# Patient Record
Sex: Female | Born: 1975 | Race: Black or African American | Hispanic: No | Marital: Single | Smoking: Never smoker
Health system: Southern US, Community
[De-identification: ages and names within clinical notes are randomized; demographics above are authoritative.]

## PROBLEM LIST (undated history)

## (undated) DIAGNOSIS — G43909 Migraine, unspecified, not intractable, without status migrainosus: Secondary | ICD-10-CM

## (undated) DIAGNOSIS — K469 Unspecified abdominal hernia without obstruction or gangrene: Secondary | ICD-10-CM

## (undated) DIAGNOSIS — O1410 Severe pre-eclampsia, unspecified trimester: Secondary | ICD-10-CM

## (undated) DIAGNOSIS — K625 Hemorrhage of anus and rectum: Secondary | ICD-10-CM

## (undated) DIAGNOSIS — O24414 Gestational diabetes mellitus in pregnancy, insulin controlled: Secondary | ICD-10-CM

## (undated) DIAGNOSIS — R7303 Prediabetes: Secondary | ICD-10-CM

## (undated) DIAGNOSIS — N939 Abnormal uterine and vaginal bleeding, unspecified: Secondary | ICD-10-CM

## (undated) HISTORY — DX: Gestational diabetes mellitus in pregnancy, insulin controlled: O24.414

## (undated) HISTORY — DX: Abnormal uterine and vaginal bleeding, unspecified: N93.9

## (undated) HISTORY — DX: Migraine, unspecified, not intractable, without status migrainosus: G43.909

## (undated) HISTORY — DX: Severe pre-eclampsia, unspecified trimester: O14.10

## (undated) HISTORY — DX: Hemorrhage of anus and rectum: K62.5

## (undated) HISTORY — DX: Prediabetes: R73.03

## (undated) HISTORY — DX: Unspecified abdominal hernia without obstruction or gangrene: K46.9

---

## 2005-07-29 DIAGNOSIS — K469 Unspecified abdominal hernia without obstruction or gangrene: Secondary | ICD-10-CM

## 2005-07-29 HISTORY — PX: HERNIA REPAIR: SHX51

## 2005-07-29 HISTORY — DX: Unspecified abdominal hernia without obstruction or gangrene: K46.9

## 2016-07-29 DIAGNOSIS — K625 Hemorrhage of anus and rectum: Secondary | ICD-10-CM

## 2016-07-29 DIAGNOSIS — N939 Abnormal uterine and vaginal bleeding, unspecified: Secondary | ICD-10-CM

## 2016-07-29 HISTORY — PX: COLONOSCOPY: SHX174

## 2016-07-29 HISTORY — PX: ENDOMETRIAL BIOPSY: SHX622

## 2016-07-29 HISTORY — DX: Hemorrhage of anus and rectum: K62.5

## 2016-07-29 HISTORY — DX: Abnormal uterine and vaginal bleeding, unspecified: N93.9

## 2018-09-28 LAB — CHG URINE PREGNANCY TEST: PREG TEST UR: POSITIVE

## 2018-10-13 ENCOUNTER — Encounter: Payer: Self-pay | Admitting: *Deleted

## 2018-10-19 ENCOUNTER — Encounter: Payer: Self-pay | Admitting: *Deleted

## 2018-10-30 ENCOUNTER — Ambulatory Visit (INDEPENDENT_AMBULATORY_CARE_PROVIDER_SITE_OTHER): Payer: Self-pay | Admitting: *Deleted

## 2018-10-30 ENCOUNTER — Encounter: Payer: Self-pay | Admitting: *Deleted

## 2018-10-30 ENCOUNTER — Other Ambulatory Visit: Payer: Self-pay

## 2018-10-30 DIAGNOSIS — O09219 Supervision of pregnancy with history of pre-term labor, unspecified trimester: Secondary | ICD-10-CM

## 2018-10-30 DIAGNOSIS — O09529 Supervision of elderly multigravida, unspecified trimester: Secondary | ICD-10-CM

## 2018-10-30 DIAGNOSIS — O0930 Supervision of pregnancy with insufficient antenatal care, unspecified trimester: Secondary | ICD-10-CM

## 2018-10-30 DIAGNOSIS — O09899 Supervision of other high risk pregnancies, unspecified trimester: Secondary | ICD-10-CM | POA: Insufficient documentation

## 2018-10-30 DIAGNOSIS — O0990 Supervision of high risk pregnancy, unspecified, unspecified trimester: Secondary | ICD-10-CM

## 2018-10-30 NOTE — Progress Notes (Signed)
Patient ID: Lisa Webb, female   DOB: Jan 09, 1976, 43 y.o.   MRN: [id number] I have reviewed the chart and agree with nursing staff's documentation of this patient's encounter.  Scheryl Darter, MD 10/30/2018 1:02 PM

## 2018-10-30 NOTE — Progress Notes (Signed)
I connected with  Lisa Webb on 10/30/18 at 9:28 AM EDT by telephone and verified that I am speaking with the correct person using two identifiers.   I discussed the limitations, risks, security and privacy concerns of performing an evaluation and management service by telephone and the availability of in person appointments. I also discussed with the patient that there may be a patient responsible charge related to this service. The patient expressed understanding and agreed to proceed.  New Ob intake completed. Total time spent talking w/pt = 1.5 hrs. Pt has unsure LMP and obtaining accurate historical information from pt was difficult at times during the interview. Pregnancy is dated by US performed on 3/2 @ [facility] - report scanned to media tab. **Note - page #3 of the paperwork scanned as verification of pregnancy does not belong to this pt.**  [place] for anatomy and initial prenatal visit both scheduled on 4/17. Pt agreed to complete registration for MyChart and Babyscripts Optimization. She understands she will receive BP cuff @ visit on 4/17 and that some visit will be by Telehealth or virtual visits.    Day, Drucilla Schmidt, RN 10/30/2018  10:48 AM

## 2018-11-09 ENCOUNTER — Encounter: Payer: Self-pay | Admitting: *Deleted

## 2018-11-13 ENCOUNTER — Encounter: Payer: Self-pay | Admitting: Obstetrics & Gynecology

## 2018-11-13 ENCOUNTER — Other Ambulatory Visit (HOSPITAL_COMMUNITY): Payer: Self-pay | Admitting: *Deleted

## 2018-11-13 ENCOUNTER — Other Ambulatory Visit: Payer: Self-pay

## 2018-11-13 ENCOUNTER — Ambulatory Visit (HOSPITAL_COMMUNITY)
Admission: RE | Admit: 2018-11-13 | Discharge: 2018-11-13 | Disposition: A | Discharge status: Home / Self Care | Payer: Medicaid Other | Source: Ambulatory Visit | Attending: Obstetrics and Gynecology | Admitting: Obstetrics and Gynecology

## 2018-11-13 ENCOUNTER — Other Ambulatory Visit: Payer: Self-pay | Admitting: Obstetrics & Gynecology

## 2018-11-13 DIAGNOSIS — O0990 Supervision of high risk pregnancy, unspecified, unspecified trimester: Secondary | ICD-10-CM | POA: Insufficient documentation

## 2018-11-13 DIAGNOSIS — O09212 Supervision of pregnancy with history of pre-term labor, second trimester: Secondary | ICD-10-CM

## 2018-11-13 DIAGNOSIS — O0932 Supervision of pregnancy with insufficient antenatal care, second trimester: Secondary | ICD-10-CM | POA: Diagnosis not present

## 2018-11-13 DIAGNOSIS — Z363 Encounter for antenatal screening for malformations: Secondary | ICD-10-CM

## 2018-11-13 DIAGNOSIS — O09522 Supervision of elderly multigravida, second trimester: Secondary | ICD-10-CM | POA: Diagnosis not present

## 2018-11-13 DIAGNOSIS — O09529 Supervision of elderly multigravida, unspecified trimester: Secondary | ICD-10-CM

## 2018-11-13 DIAGNOSIS — Z3A26 26 weeks gestation of pregnancy: Secondary | ICD-10-CM

## 2018-11-13 DIAGNOSIS — O3412 Maternal care for benign tumor of corpus uteri, second trimester: Secondary | ICD-10-CM | POA: Diagnosis not present

## 2018-11-13 DIAGNOSIS — Z3687 Encounter for antenatal screening for uncertain dates: Secondary | ICD-10-CM

## 2018-11-13 DIAGNOSIS — O0930 Supervision of pregnancy with insufficient antenatal care, unspecified trimester: Secondary | ICD-10-CM

## 2018-11-18 ENCOUNTER — Encounter: Payer: Self-pay | Admitting: Obstetrics and Gynecology

## 2018-11-18 ENCOUNTER — Other Ambulatory Visit: Payer: Self-pay

## 2018-11-18 ENCOUNTER — Ambulatory Visit (INDEPENDENT_AMBULATORY_CARE_PROVIDER_SITE_OTHER): Payer: Medicaid Other | Admitting: Obstetrics and Gynecology

## 2018-11-18 ENCOUNTER — Telehealth: Payer: Self-pay | Admitting: Obstetrics and Gynecology

## 2018-11-18 ENCOUNTER — Other Ambulatory Visit (HOSPITAL_COMMUNITY)
Admission: RE | Admit: 2018-11-18 | Discharge: 2018-11-18 | Disposition: A | Discharge status: Home / Self Care | Payer: Medicaid Other | Source: Ambulatory Visit | Attending: Obstetrics and Gynecology | Admitting: Obstetrics and Gynecology

## 2018-11-18 VITALS — BP 118/74 | HR 79 | Ht 63.0 in | Wt 223.0 lb

## 2018-11-18 DIAGNOSIS — Z3A26 26 weeks gestation of pregnancy: Secondary | ICD-10-CM | POA: Diagnosis not present

## 2018-11-18 DIAGNOSIS — O09212 Supervision of pregnancy with history of pre-term labor, second trimester: Secondary | ICD-10-CM | POA: Diagnosis not present

## 2018-11-18 DIAGNOSIS — O0990 Supervision of high risk pregnancy, unspecified, unspecified trimester: Secondary | ICD-10-CM

## 2018-11-18 DIAGNOSIS — O09522 Supervision of elderly multigravida, second trimester: Secondary | ICD-10-CM

## 2018-11-18 DIAGNOSIS — O1210 Gestational proteinuria, unspecified trimester: Secondary | ICD-10-CM

## 2018-11-18 DIAGNOSIS — O1212 Gestational proteinuria, second trimester: Secondary | ICD-10-CM | POA: Diagnosis not present

## 2018-11-18 DIAGNOSIS — Z113 Encounter for screening for infections with a predominantly sexual mode of transmission: Secondary | ICD-10-CM | POA: Diagnosis not present

## 2018-11-18 DIAGNOSIS — O09219 Supervision of pregnancy with history of pre-term labor, unspecified trimester: Secondary | ICD-10-CM

## 2018-11-18 DIAGNOSIS — O0992 Supervision of high risk pregnancy, unspecified, second trimester: Secondary | ICD-10-CM | POA: Insufficient documentation

## 2018-11-18 DIAGNOSIS — O0932 Supervision of pregnancy with insufficient antenatal care, second trimester: Secondary | ICD-10-CM

## 2018-11-18 DIAGNOSIS — Z1151 Encounter for screening for human papillomavirus (HPV): Secondary | ICD-10-CM

## 2018-11-18 DIAGNOSIS — R81 Glycosuria: Secondary | ICD-10-CM

## 2018-11-18 DIAGNOSIS — O09899 Supervision of other high risk pregnancies, unspecified trimester: Secondary | ICD-10-CM

## 2018-11-18 DIAGNOSIS — O0930 Supervision of pregnancy with insufficient antenatal care, unspecified trimester: Secondary | ICD-10-CM

## 2018-11-18 DIAGNOSIS — O09523 Supervision of elderly multigravida, third trimester: Secondary | ICD-10-CM

## 2018-11-18 DIAGNOSIS — Z124 Encounter for screening for malignant neoplasm of cervix: Secondary | ICD-10-CM | POA: Diagnosis present

## 2018-11-18 LAB — GLUCOSE, CAPILLARY: Glucose-Capillary: 147 mg/dL — ABNORMAL HIGH (ref 70–99)

## 2018-11-18 LAB — POCT URINALYSIS DIP (DEVICE)
Bilirubin Urine: NEGATIVE
Glucose, UA: >=1000 mg/dL — AB
Leukocytes,Ua: NEGATIVE
Nitrite: NEGATIVE
Protein, ur: 30 mg/dL — AB
Specific Gravity, Urine: 1.025 (ref 1.005–1.030)
Urobilinogen, UA: 0.2 mg/dL (ref 0.0–1.0)
pH: 5.5 (ref 5.0–8.0)

## 2018-11-18 NOTE — Progress Notes (Signed)
New OB Note  11/18/2018   [facility]  Chief Complaint: NOB  Transfer of Care Patient: no  History of Present Illness: Ms. Lisa HockeyRobinson is a 43 y.o. [id number] @ 26/6 weeks (EDC 7/23, based on 26wk u/s) Patient's last menstrual period was 05/12/2018 (approximate).).  Preg complicated by has Supervision of high risk pregnancy, antepartum; Late prenatal care, antepartum; Advanced maternal age in multigravida; and History of preterm delivery, currently pregnant on their problem list.   Any events prior to today's visit: no She was using no method when she conceived.  She has Negative signs or symptoms of miscarriage or preterm labor On any medications around the time she conceived/early pregnancy: No   ROS: A 12-point review of systems was performed and negative, except as stated in the above HPI.  OBGYN History: As per HPI. OB History  Gravida Para Term Preterm AB Living  5 3 2 1 1 3   SAB TAB Ectopic Multiple Live Births  1       3    # Outcome Date GA Lbr Len/2nd Weight Sex Delivery Anes PTL Lv  5 Current           4 Term 04/03/06 6859w0d  8 lb 3 oz (3.714 kg) F Vag-Spont EPI N LIV  3 SAB 02/14/05 6760w0d         2 Term 09/19/99 3859w0d  7 lb 6 oz (3.345 kg) F Vag-Spont None N LIV  1 Preterm 09/08/94   4 lb 7 oz (2.013 kg) M Vag-Spont None N LIV    Any issues with any prior pregnancies: G1 PTL, PTB Prior children are healthy, doing well, and without any problems or issues: yes History of pap smears: Yes. Last pap smear unsure. H/o abnormal paps   Past Medical History: Past Medical History:  Diagnosis Date  . Abnormal uterine bleeding 2018  . Hernia, abdominal 2007  . Migraines    pt does not take meds  . Rectal bleeding 2018    Past Surgical History: Past Surgical History:  Procedure Laterality Date  . COLONOSCOPY  2018   rectal bleeding, Fam Hx of colon Ca  . ENDOMETRIAL BIOPSY  2018   normal  . HERNIA REPAIR  2007    Family History:  Family  History  Problem Relation Age of Onset  . Cancer Mother        colon  . Hypertension Father   . Dementia Father   . Diabetes Sister   . Thyroid disease Sister     She denies any history of mental retardation, birth defects or genetic disorders in her or the FOB's history  Social History:  Social History   Socioeconomic History  . Marital status: Single    Spouse name: Not on file  . Number of children: Not on file  . Years of education: Not on file  . Highest education level: Not on file  Occupational History    Employer: [employer]  Social Needs  . Financial resource strain: Not on file  . Food insecurity:    Worry: Never true    Inability: Never true  . Transportation needs:    Medical: No    Non-medical: No  Tobacco Use  . Smoking status: Never Smoker  . Smokeless tobacco: Never Used  Substance and Sexual Activity  . Alcohol use: Not Currently  . Drug use: Never  . Sexual activity: Not Currently  Lifestyle  . Physical activity:    Days per week:  Not on file    Minutes per session: Not on file  . Stress: Not on file  Relationships  . Social connections:    Talks on phone: Not on file    Gets together: Not on file    Attends religious service: Not on file    Active member of club or organization: Not on file    Attends meetings of clubs or organizations: Not on file    Relationship status: Not on file  . Intimate partner violence:    Fear of current or ex partner: No    Emotionally abused: No    Physically abused: No    Forced sexual activity: No  Other Topics Concern  . Not on file  Social History Narrative  . Not on file    Allergy: No Known Allergies  Health Maintenance:  Mammogram Up to Date: no  Current Outpatient Medications: PNV  Physical Exam:   BP 118/74   Pulse 79   Ht 5\' 3"  (1.6 m)   Wt 223 lb (101.2 kg)   LMP 05/12/2018 (Approximate)   BMI 39.50 kg/m  Body mass index is 39.5 kg/m. Contractions: Not present Vag.  Bleeding: None. Fundal height: 27 FHTs: 150s  General appearance: Well nourished, well developed female in no acute distress.  Neck:  Supple, normal appearance, and no thyromegaly  Cardiovascular: S1, S2 normal, no murmur, rub or gallop, regular rate and rhythm Respiratory:  Clear to auscultation bilateral. Normal respiratory effort Abdomen: positive bowel sounds and no masses, hernias; diffusely non tender to palpation, non distended Breasts: breasts appear normal, no suspicious masses, no skin or nipple changes or axillary nodes, and normal palpation. Neuro/Psych:  Normal mood and affect.  Skin:  Warm and dry.  Lymphatic:  No inguinal lymphadenopathy.   Pelvic exam: is not limited by body habitus EGBUS: within normal limits, Vagina: within normal limits and with no blood in the vault, Cervix: normal appearing cervix without discharge or lesions, closed/long/high, Uterus:  enlarged, c/w 26-28 week size, and Adnexa:  normal adnexa and no mass, fullness, tenderness  Laboratory: none  Imaging:  reviewed  Assessment: pt doing well  Plan: 1. Supervision of high risk pregnancy, antepartum Routine care. Will bring back for GTT next week. Ask about [facility]. edc changed to 7/23 based on 26wk u/s - Genetic Screening - Obstetric Panel, Including HIV - Culture, OB Urine - Cytology - PAP( [place]) - Hemoglobin A1c - TSH - CMP and Liver - Protein / creatinine ratio, urine  2. Late prenatal care, antepartum  3. Multigravida of advanced maternal age in third trimester cffdna today. Pt offered formal GC and would like to wait and see if nips is abnormal. Serial growth scans and start ap testing at 36wks - Hemoglobin A1c - TSH - CMP and Liver - Protein / creatinine ratio, urine  4. History of preterm delivery, currently pregnant G1 born about two months early due to PTL. G2 and 3 were normal term SVDs.  5. Glucosuria CBG 147. Had cereal at 0630 today. F/u a1c today and PRN 2hr next  week  6. Proteinuria F/u pc ratio  Problem list reviewed and updated.  Follow up in 1 weeks.  >50% of 25 min visit spent on counseling and coordination of care.     Cornelia Copa MD Attending Center for [facility])

## 2018-11-18 NOTE — Telephone Encounter (Signed)
Called the patient to inform of upcoming appointment details and new location. The patient verbalized understanding.

## 2018-11-19 LAB — OBSTETRIC PANEL, INCLUDING HIV
Antibody Screen: NEGATIVE
Basophils Absolute: 0 10*3/uL (ref 0.0–0.2)
Basos: 0 %
EOS (ABSOLUTE): 0.1 10*3/uL (ref 0.0–0.4)
Eos: 1 %
HIV Screen 4th Generation wRfx: NONREACTIVE
Hematocrit: 36.8 % (ref 34.0–46.6)
Hemoglobin: 12.4 g/dL (ref 11.1–15.9)
Hepatitis B Surface Ag: NEGATIVE
Immature Grans (Abs): 0 10*3/uL (ref 0.0–0.1)
Immature Granulocytes: 0 %
Lymphocytes Absolute: 1.2 10*3/uL (ref 0.7–3.1)
Lymphs: 23 %
MCH: 26.6 pg (ref 26.6–33.0)
MCHC: 33.7 g/dL (ref 31.5–35.7)
MCV: 79 fL (ref 79–97)
Monocytes Absolute: 0.6 10*3/uL (ref 0.1–0.9)
Monocytes: 11 %
Neutrophils Absolute: 3.4 10*3/uL (ref 1.4–7.0)
Neutrophils: 65 %
Platelets: 239 10*3/uL (ref 150–450)
RBC: 4.67 x10E6/uL (ref 3.77–5.28)
RDW: 13.3 % (ref 11.7–15.4)
RPR Ser Ql: NONREACTIVE
Rh Factor: POSITIVE
Rubella Antibodies, IGG: <0.9 index — ABNORMAL LOW (ref >0.99)
WBC: 5.3 10*3/uL (ref 3.4–10.8)

## 2018-11-19 LAB — CMP AND LIVER
ALT: 20 IU/L (ref 0–32)
AST: 20 IU/L (ref 0–40)
Albumin: 3.6 g/dL — ABNORMAL LOW (ref 3.8–4.8)
Alkaline Phosphatase: 79 IU/L (ref 39–117)
BUN: 7 mg/dL (ref 6–24)
Bilirubin Total: <0.2 mg/dL (ref 0.0–1.2)
Bilirubin, Direct: 0.06 mg/dL (ref 0.00–0.40)
CO2: 17 mmol/L — ABNORMAL LOW (ref 20–29)
Calcium: 9.1 mg/dL (ref 8.7–10.2)
Chloride: 104 mmol/L (ref 96–106)
Creatinine, Ser: 0.55 mg/dL — ABNORMAL LOW (ref 0.57–1.00)
Glucose: 161 mg/dL — ABNORMAL HIGH (ref 65–99)
Potassium: 4.5 mmol/L (ref 3.5–5.2)
Sodium: 133 mmol/L — ABNORMAL LOW (ref 134–144)
Total Protein: 6 g/dL (ref 6.0–8.5)

## 2018-11-19 LAB — HEMOGLOBIN A1C
Est. average glucose Bld gHb Est-mCnc: 183 mg/dL
Hgb A1c MFr Bld: 8 % — ABNORMAL HIGH (ref 4.8–5.6)

## 2018-11-19 LAB — TSH: TSH: 1.22 u[IU]/mL (ref 0.450–4.500)

## 2018-11-19 LAB — PROTEIN / CREATININE RATIO, URINE
Creatinine, Urine: 177.2 mg/dL
Protein, Ur: 40.5 mg/dL
Protein/Creat Ratio: 229 mg/g creat — ABNORMAL HIGH (ref 0–200)

## 2018-11-20 ENCOUNTER — Encounter: Payer: Self-pay | Admitting: Obstetrics and Gynecology

## 2018-11-21 LAB — CULTURE, OB URINE

## 2018-11-21 LAB — URINE CULTURE, OB REFLEX

## 2018-11-24 LAB — CYTOLOGY - PAP
Chlamydia: NEGATIVE
Diagnosis: NEGATIVE
HPV: NOT DETECTED
Neisseria Gonorrhea: NEGATIVE

## 2018-11-25 ENCOUNTER — Other Ambulatory Visit: Payer: Self-pay

## 2018-11-25 ENCOUNTER — Ambulatory Visit (INDEPENDENT_AMBULATORY_CARE_PROVIDER_SITE_OTHER): Payer: Medicaid Other | Admitting: Obstetrics and Gynecology

## 2018-11-25 ENCOUNTER — Encounter: Payer: Self-pay | Admitting: Obstetrics and Gynecology

## 2018-11-25 VITALS — BP 133/84 | HR 71 | Temp 98.1°F | Wt 226.2 lb

## 2018-11-25 DIAGNOSIS — R7303 Prediabetes: Secondary | ICD-10-CM | POA: Insufficient documentation

## 2018-11-25 DIAGNOSIS — O24419 Gestational diabetes mellitus in pregnancy, unspecified control: Secondary | ICD-10-CM

## 2018-11-25 DIAGNOSIS — Z3A27 27 weeks gestation of pregnancy: Secondary | ICD-10-CM

## 2018-11-25 DIAGNOSIS — O0990 Supervision of high risk pregnancy, unspecified, unspecified trimester: Secondary | ICD-10-CM

## 2018-11-26 ENCOUNTER — Encounter: Payer: Medicaid Other | Attending: Obstetrics & Gynecology | Admitting: *Deleted

## 2018-11-26 ENCOUNTER — Ambulatory Visit: Payer: Self-pay | Admitting: *Deleted

## 2018-11-26 ENCOUNTER — Telehealth: Payer: Self-pay | Admitting: Family Medicine

## 2018-11-26 ENCOUNTER — Other Ambulatory Visit: Payer: Self-pay | Admitting: General Practice

## 2018-11-26 DIAGNOSIS — Z3A28 28 weeks gestation of pregnancy: Secondary | ICD-10-CM | POA: Diagnosis not present

## 2018-11-26 DIAGNOSIS — O24410 Gestational diabetes mellitus in pregnancy, diet controlled: Secondary | ICD-10-CM

## 2018-11-26 NOTE — Telephone Encounter (Signed)
Attempted to call patient with her fetal echo appointment. No answer, left detailed message with appointment time, date (5/8 @ 9am) and what was needed for the appointment ( Id, arrive early and $100 copay).

## 2018-11-26 NOTE — Progress Notes (Signed)
  Patient was seen on 11/26/2018 for Gestational Diabetes self-management. EDD 02/19/2019. Patient states no history of GDM. However her A1c was taken 11/18/2018 at it was 8.0%. Diet history obtained. Patient eats fair to good variety of all food groups with some fast food for breakfast or lunch on work days. Beverages include water, regular soda occasionally for lunch and sweet tea or juice at dinner.  The following learning objectives were met by the patient :   States the definition of Gestational Diabetes  States why dietary management is important in controlling blood glucose  Describes the effects of carbohydrates on blood glucose levels  Demonstrates ability to create a balanced meal plan  Demonstrates carbohydrate counting   States when to check blood glucose levels  Demonstrates proper blood glucose monitoring techniques  States the effect of stress and exercise on blood glucose levels  States the importance of limiting caffeine and abstaining from alcohol and smoking  Plan:  Aim for 3 Carb Choices per meal (45 grams) +/- 1 either way  Aim for 1-2 Carbs per snack Begin reading food labels for Total Carbohydrate of foods If OK with your MD, consider  increasing your activity level by walking, Arm Chair Exercises or other activity daily as tolerated Begin checking BG before breakfast and 2 hours after first bite of breakfast, lunch and dinner as directed by MD  Bring Log Book/Sheet and meter to every medical appointment OR use Baby Scripts (see below) Baby Scripts:  Patient was introduced to [employer] and plans to use as record of BG electronically Take medication if directed by MD  Blood glucose monitor given: [employer] Code Lot # [id number] Blood glucose reading: 180 mg/dl  Patient was referred yesterday by Dr. Ilda Basset for Diabetes Education ASAP. Dr. Loma Boston was in the [facility] today and I reviewed BG and history of elevated A1c with him and her recommended  starting insulin based on "weight" formula. I sent Dr. Ilda Basset a Staff Message for same. I called patient later in the day with that recommendation. She was concerned about taking time off for multiple appointments. I offered for her to let me know her work schedule and I would see her on her day off. She agreed to text me with that information when she gets her schedule. She has no insurance so she will need ReliOn Regular and ReliOn NPH insulin from [place]. She prefers the [facility] in [place] for that Rx.   Based on her weight of 226 pounds = 102 kg x 0.9 = 90 units TDD  AM = 60 units with 20 from Regular and 40 from NPH  Dinner = 15 units Regular  Bedtime = 15 units NPH    Patient instructed to monitor glucose levels: FBS: 60 - 95 mg/dl 2 hour: <120 mg/dl  Patient received the following handouts:  Nutrition Diabetes and Pregnancy  Carbohydrate Counting List  BG Log Sheet  Patient will be seen for follow-up in 1 week for insulin instruction if ordered and as needed.

## 2018-11-26 NOTE — Progress Notes (Signed)
Prenatal Visit Note Date: 11/25/2018 [facility]  Subjective:  Lisa Webb is a 43 y.o. [id number] at [redacted]w[redacted]d being seen today for ongoing prenatal care.  She is currently monitored for the following issues for this high-risk pregnancy and has Supervision of high risk pregnancy, antepartum; Late prenatal care, antepartum; Advanced maternal age in multigravida; History of preterm delivery, currently pregnant; Glucosuria; Proteinuria affecting pregnancy; and GDM (gestational diabetes mellitus) on their problem list.  Patient reports no complaints.   Contractions: Not present. Vag. Bleeding: None.  Movement: Present. Denies leaking of fluid.   The following portions of the patient's history were reviewed and updated as appropriate: allergies, current medications, past family history, past medical history, past social history, past surgical history and problem list. Problem list updated.  Objective:   Vitals:   11/25/18 1510  BP: 133/84  Pulse: 71  Temp: 98.1 F (36.7 C)  Weight: 226 lb 3.2 oz (102.6 kg)    Fetal Status: Fetal Heart Rate (bpm): 137   Movement: Present     General:  Alert, oriented and cooperative. Patient is in no acute distress.  Skin: Skin is warm and dry. No rash noted.   Cardiovascular: Normal heart rate noted  Respiratory: Normal respiratory effort, no problems with respiration noted  Abdomen: Soft, gravid, appropriate for gestational age. Pain/Pressure: Present     Pelvic:  Cervical exam deferred        Extremities: Normal range of motion.  Edema: None  Mental Status: Normal mood and affect. Normal behavior. Normal judgment and thought content.   Urinalysis:      Assessment and Plan:  Pregnancy: [id number] at [redacted]w[redacted]d  1. Supervision of high risk pregnancy, antepartum Routine care - Referral to Nutrition and Diabetes Services  2. Gestational diabetes mellitus (GDM), antepartum, gestational diabetes method of control unspecified -  Referral to Nutrition and Diabetes Services Will have her get set up for a fetal echo given likely poorly controlled dm throughout pregnancy.   Preterm labor symptoms and general obstetric precautions including but not limited to vaginal bleeding, contractions, leaking of fluid and fetal movement were reviewed in detail with the patient. Please refer to After Visit Summary for other counseling recommendations.  Return in about 1 day (around 11/26/2018) for diabetes education visit asap.   Dateland Bing, MD

## 2018-11-26 NOTE — Progress Notes (Unsigned)
Patient ID: Lisa Webb, female   DOB: 11/17/1975, 43 y.o.   MRN: [id number]   [facility] [phone]) to get patient scheduled for her fetal echo. Patient was scheduled for 5/8 @ 9:00am

## 2018-11-27 ENCOUNTER — Other Ambulatory Visit: Payer: Self-pay | Admitting: Obstetrics and Gynecology

## 2018-11-27 MED ORDER — INSULIN NPH (HUMAN) (ISOPHANE) 100 UNIT/ML ~~LOC~~ SUSP: SUBCUTANEOUS | 3 refills | Status: DC

## 2018-11-27 MED ORDER — "INSULIN SYRINGE-NEEDLE U-100 31G X 5/16"" 1 ML MISC": 1.0000 | Freq: Four times a day (QID) | 2 refills | Status: DC

## 2018-11-27 MED ORDER — INSULIN REGULAR HUMAN 100 UNIT/ML IJ SOLN: INTRAMUSCULAR | 11 refills | Status: DC

## 2018-11-30 ENCOUNTER — Telehealth: Payer: Self-pay | Admitting: *Deleted

## 2018-11-30 NOTE — Telephone Encounter (Signed)
Received babyscripts alert email from 11/28/18 Saturday. Will forward to provider who saw her last.

## 2018-12-01 ENCOUNTER — Other Ambulatory Visit: Payer: Self-pay | Admitting: Obstetrics and Gynecology

## 2018-12-01 ENCOUNTER — Other Ambulatory Visit: Payer: Self-pay | Admitting: General Practice

## 2018-12-01 ENCOUNTER — Ambulatory Visit: Payer: Self-pay | Admitting: *Deleted

## 2018-12-01 ENCOUNTER — Telehealth: Payer: Self-pay | Admitting: *Deleted

## 2018-12-01 DIAGNOSIS — O24410 Gestational diabetes mellitus in pregnancy, diet controlled: Secondary | ICD-10-CM

## 2018-12-01 MED ORDER — "INSULIN SYRINGE-NEEDLE U-100 31G X 5/16"" 1 ML MISC": 1.0000 | Freq: Four times a day (QID) | 5 refills | Status: DC

## 2018-12-01 MED ORDER — INSULIN NPH (HUMAN) (ISOPHANE) 100 UNIT/ML ~~LOC~~ SUSP: SUBCUTANEOUS | 5 refills | Status: DC

## 2018-12-01 MED ORDER — INSULIN REGULAR HUMAN 100 UNIT/ML IJ SOLN: INTRAMUSCULAR | 5 refills | Status: DC

## 2018-12-01 NOTE — Telephone Encounter (Signed)
Patient called to cancel scheduled appointment for insulin instruction today. She states she has a migraine headache with a fever. She has not tested her temperature but she states she feels hot.   She states she was at [facility] to pick up her Rx's for insulin and syringes at the time of her call.   She had texted me over this past weekend to let me know her work schedule so we could schedule her appointment for insulin instruction. She was available this afternoon and said she is off on Thursday but had planned to work with her Dad all day Thursday.  She states her BG are too high and I again reinforced the need to get on insulin to bring her BG into a safe level.    I again offered Thursday as another date I could see her. She is still not sure she can make that but states she will text me if she can come.

## 2018-12-01 NOTE — Progress Notes (Signed)
OB Telephone Note Patient called (VM left) and told that meds and supplies sent to [facility] pharmacy.  Cornelia Copa MD Attending Center for [employer] (Faculty Practice) 12/01/2018 Time: [phone]

## 2018-12-02 ENCOUNTER — Telehealth: Payer: Self-pay

## 2018-12-02 ENCOUNTER — Ambulatory Visit: Payer: Self-pay | Admitting: *Deleted

## 2018-12-02 NOTE — Telephone Encounter (Signed)
Received a note from [facility] stating that pt is c/o of migraines.  LM for pt to please return call to the office.  Re: Per Dr. Marice Potter, if pt is having migraines prescribe Imitrex 100 mg x 1, may rpt in 2 hours, 12 pack refills.

## 2018-12-03 ENCOUNTER — Ambulatory Visit: Payer: Self-pay | Admitting: *Deleted

## 2018-12-03 ENCOUNTER — Other Ambulatory Visit: Payer: Self-pay

## 2018-12-03 ENCOUNTER — Encounter: Payer: Medicaid Other | Attending: Obstetrics & Gynecology | Admitting: *Deleted

## 2018-12-03 DIAGNOSIS — O24410 Gestational diabetes mellitus in pregnancy, diet controlled: Secondary | ICD-10-CM | POA: Insufficient documentation

## 2018-12-03 DIAGNOSIS — Z3A28 28 weeks gestation of pregnancy: Secondary | ICD-10-CM | POA: Insufficient documentation

## 2018-12-03 NOTE — Progress Notes (Signed)
Insulin Instruction  Patient was seen on 12/03/2018 for insulin instruction.  MD orders are:  Pre breakfast: 40 units NPH   20 units Regular  Total:  60 units before breakfast  Pre dinner: 15 units Regular  Bedtime: 15 units NPH  Patient arrived at appointment with ReliOn Regular and ReliOn NPH vials and 1.0 cc insulin syringes as prescribed. She states she no longer has a headache, and feels it was sinus not migraine after all.   The following learning objectives were met by the patient during this visit:   Insulin Action of NPH and Regular insulins  Reviewed syringe & vial including # units per syringe and vial  Hygiene and storage  Drawing up single and mixed doses if using vials   Single dose   Mixed dose  Rotation of Sites  Hypoglycemia- symptoms, causes, treatment choices  Record keeping and MD follow up patient is using Baby Scripts  Patient demonstrated understanding of insulin administration by return demonstration.  Patient received the following handouts:  Insulin Instruction Handout                                        Patient to start on insulin as Rx'd by MD see above  Patient will be seen for follow-up in via Baby Scripts and as needed.

## 2018-12-03 NOTE — Telephone Encounter (Signed)
LM for pt to return call to the office or respond via MyChart message and if she continues to have questions or concerns to please give the office a call.  MyChart message sent.

## 2018-12-07 ENCOUNTER — Telehealth (INDEPENDENT_AMBULATORY_CARE_PROVIDER_SITE_OTHER): Payer: Self-pay | Admitting: Lactation Services

## 2018-12-07 DIAGNOSIS — O24414 Gestational diabetes mellitus in pregnancy, insulin controlled: Secondary | ICD-10-CM

## 2018-12-07 NOTE — Telephone Encounter (Signed)
Please call her and tell her to go up 4 units on each of her insulin doses She is currently on: Before breakfast-nph 40/regular 20 With dinner: regular 15 Before bed: NPH 15  Change her to: Before breakfast-nph 44/regular 24 With dinner: regular 19 Before bed: NPH 19  thanks

## 2018-12-07 NOTE — Telephone Encounter (Signed)
Called Pt per Dr. Vergie Living to inform her on insulin changes to begin today based on her Baby Scripts readings.   Informed pt that she is to change her insulin dosages to:   Before Breakfast: NPH 44 units/Regular 24 units With Dinner: Regular 19 units Before bed: NPH 19 units  Pt read back changes and reports understanding.

## 2018-12-11 ENCOUNTER — Other Ambulatory Visit: Payer: Self-pay

## 2018-12-11 ENCOUNTER — Ambulatory Visit (INDEPENDENT_AMBULATORY_CARE_PROVIDER_SITE_OTHER): Payer: Medicaid Other | Admitting: Obstetrics and Gynecology

## 2018-12-11 ENCOUNTER — Encounter: Payer: Self-pay | Admitting: Obstetrics and Gynecology

## 2018-12-11 VITALS — BP 159/91 | HR 80 | Temp 98.4°F | Wt 240.0 lb

## 2018-12-11 DIAGNOSIS — O09523 Supervision of elderly multigravida, third trimester: Secondary | ICD-10-CM | POA: Diagnosis not present

## 2018-12-11 DIAGNOSIS — O09219 Supervision of pregnancy with history of pre-term labor, unspecified trimester: Secondary | ICD-10-CM | POA: Diagnosis not present

## 2018-12-11 DIAGNOSIS — O09899 Supervision of other high risk pregnancies, unspecified trimester: Secondary | ICD-10-CM

## 2018-12-11 DIAGNOSIS — O0990 Supervision of high risk pregnancy, unspecified, unspecified trimester: Secondary | ICD-10-CM

## 2018-12-11 DIAGNOSIS — O24414 Gestational diabetes mellitus in pregnancy, insulin controlled: Secondary | ICD-10-CM | POA: Diagnosis not present

## 2018-12-11 DIAGNOSIS — Z3A30 30 weeks gestation of pregnancy: Secondary | ICD-10-CM

## 2018-12-11 NOTE — Progress Notes (Signed)
Subjective:  Lisa Webb is a 43 y.o. [phone] at 17w1dbeing seen today for ongoing prenatal care.  She is currently monitored for the following issues for this high-risk pregnancy and has Supervision of high risk pregnancy, antepartum; Late prenatal care, antepartum; Advanced maternal age in multigravida; History of preterm delivery, currently pregnant; Glucosuria; and GDM (gestational diabetes mellitus) on their problem list.  Patient reports sweaty with low CBG's. Denies HA or visual changes.  Contractions: Not present. Vag. Bleeding: None.  Movement: Present. Denies leaking of fluid.   The following portions of the patient's history were reviewed and updated as appropriate: allergies, current medications, past family history, past medical history, past social history, past surgical history and problem list. Problem list updated.  Objective:   Vitals:   12/11/18 1139  BP: (!) 159/91  Pulse: 80  Temp: 98.4 F (36.9 C)  Weight: 240 lb (108.9 kg)    Fetal Status: Fetal Heart Rate (bpm): 159   Movement: Present     General:  Alert, oriented and cooperative. Patient is in no acute distress.  Skin: Skin is warm and dry. No rash noted.   Cardiovascular: Normal heart rate noted  Respiratory: Normal respiratory effort, no problems with respiration noted  Abdomen: Soft, gravid, appropriate for gestational age. Pain/Pressure: Present     Pelvic:  Cervical exam deferred        Extremities: Normal range of motion.  Edema: Moderate pitting, indentation subsides rapidly  Mental Status: Normal mood and affect. Normal behavior. Normal judgment and thought content.   Urinalysis:      Assessment and Plan:  Pregnancy: [id number] 354w1d1. Insulin controlled gestational diabetes mellitus (GDM) in third trimester Will insulin as follows NPH 40 units/Reg 15 units breakfast Reg 15 units pre dinner NPH 15 units qhs - CBC - Comp Met (CMET) - Protein / creatinine ratio, urine  2. Supervision of  high risk pregnancy, antepartum Stable  3. Multigravida of advanced maternal age in third trimester   4. History of preterm delivery, currently pregnant   Preterm labor symptoms and general obstetric precautions including but not limited to vaginal bleeding, contractions, leaking of fluid and fetal movement were reviewed in detail with the patient. Please refer to After Visit Summary for other counseling recommendations.  Return in about 1 week (around 12/18/2018) for OB visit, face to face .   ErChancy MilroyMD

## 2018-12-11 NOTE — Patient Instructions (Addendum)
40 units NPH/15 units Reg with breakfast 15 units Reg pre dinner 15 units NPH evening       Third Trimester of Pregnancy The third trimester is from week 28 through week 40 (months 7 through 9). The third trimester is a time when the unborn baby (fetus) is growing rapidly. At the end of the ninth month, the fetus is about 20 inches in length and weighs 6-10 pounds. Body changes during your third trimester Your body will continue to go through many changes during pregnancy. The changes vary from woman to woman. During the third trimester:  Your weight will continue to increase. You can expect to gain 25-35 pounds (11-16 kg) by the end of the pregnancy.  You may begin to get stretch marks on your hips, abdomen, and breasts.  You may urinate more often because the fetus is moving lower into your pelvis and pressing on your bladder.  You may develop or continue to have heartburn. This is caused by increased hormones that slow down muscles in the digestive tract.  You may develop or continue to have constipation because increased hormones slow digestion and cause the muscles that push waste through your intestines to relax.  You may develop hemorrhoids. These are swollen veins (varicose veins) in the rectum that can itch or be painful.  You may develop swollen, bulging veins (varicose veins) in your legs.  You may have increased body aches in the pelvis, back, or thighs. This is due to weight gain and increased hormones that are relaxing your joints.  You may have changes in your hair. These can include thickening of your hair, rapid growth, and changes in texture. Some women also have hair loss during or after pregnancy, or hair that feels dry or thin. Your hair will most likely return to normal after your baby is born.  Your breasts will continue to grow and they will continue to become tender. A yellow fluid (colostrum) may leak from your breasts. This is the first milk you are  producing for your baby.  Your belly button may stick out.  You may notice more swelling in your hands, face, or ankles.  You may have increased tingling or numbness in your hands, arms, and legs. The skin on your belly may also feel numb.  You may feel short of breath because of your expanding uterus.  You may have more problems sleeping. This can be caused by the size of your belly, increased need to urinate, and an increase in your body's metabolism.  You may notice the fetus "dropping," or moving lower in your abdomen (lightening).  You may have increased vaginal discharge.  You may notice your joints feel loose and you may have pain around your pelvic bone. What to expect at prenatal visits You will have prenatal exams every 2 weeks until week 36. Then you will have weekly prenatal exams. During a routine prenatal visit:  You will be weighed to make sure you and the baby are growing normally.  Your blood pressure will be taken.  Your abdomen will be measured to track your baby's growth.  The fetal heartbeat will be listened to.  Any test results from the previous visit will be discussed.  You may have a cervical check near your due date to see if your cervix has softened or thinned (effaced).  You will be tested for Group B streptococcus. This happens between 35 and 37 weeks. Your health care provider may ask you:  What your birth  plan is.  How you are feeling.  If you are feeling the baby move.  If you have had any abnormal symptoms, such as leaking fluid, bleeding, severe headaches, or abdominal cramping.  If you are using any tobacco products, including cigarettes, chewing tobacco, and electronic cigarettes.  If you have any questions. Other tests or screenings that may be performed during your third trimester include:  Blood tests that check for low iron levels (anemia).  Fetal testing to check the health, activity level, and growth of the fetus. Testing is  done if you have certain medical conditions or if there are problems during the pregnancy.  Nonstress test (NST). This test checks the health of your baby to make sure there are no signs of problems, such as the baby not getting enough oxygen. During this test, a belt is placed around your belly. The baby is made to move, and its heart rate is monitored during movement. What is false labor? False labor is a condition in which you feel small, irregular tightenings of the muscles in the womb (contractions) that usually go away with rest, changing position, or drinking water. These are called Braxton Hicks contractions. Contractions may last for hours, days, or even weeks before true labor sets in. If contractions come at regular intervals, become more frequent, increase in intensity, or become painful, you should see your health care provider. What are the signs of labor?  Abdominal cramps.  Regular contractions that start at 10 minutes apart and become stronger and more frequent with time.  Contractions that start on the top of the uterus and spread down to the lower abdomen and back.  Increased pelvic pressure and dull back pain.  A watery or bloody mucus discharge that comes from the vagina.  Leaking of amniotic fluid. This is also known as your "water breaking." It could be a slow trickle or a gush. Let your health care provider know if it has a color or strange odor. If you have any of these signs, call your health care provider right away, even if it is before your due date. Follow these instructions at home: Medicines  Follow your health care provider's instructions regarding medicine use. Specific medicines may be either safe or unsafe to take during pregnancy.  Take a prenatal vitamin that contains at least 600 micrograms (mcg) of folic acid.  If you develop constipation, try taking a stool softener if your health care provider approves. Eating and drinking   Eat a balanced diet  that includes fresh fruits and vegetables, whole grains, good sources of protein such as meat, eggs, or tofu, and low-fat dairy. Your health care provider will help you determine the amount of weight gain that is right for you.  Avoid raw meat and uncooked cheese. These carry germs that can cause birth defects in the baby.  If you have low calcium intake from food, talk to your health care provider about whether you should take a daily calcium supplement.  Eat four or five small meals rather than three large meals a day.  Limit foods that are high in fat and processed sugars, such as fried and sweet foods.  To prevent constipation: ? Drink enough fluid to keep your urine clear or pale yellow. ? Eat foods that are high in fiber, such as fresh fruits and vegetables, whole grains, and beans. Activity  Exercise only as directed by your health care provider. Most women can continue their usual exercise routine during pregnancy. Try to exercise  for 30 minutes at least 5 days a week. Stop exercising if you experience uterine contractions.  Avoid heavy lifting.  Do not exercise in extreme heat or humidity, or at high altitudes.  Wear low-heel, comfortable shoes.  Practice good posture.  You may continue to have sex unless your health care provider tells you otherwise. Relieving pain and discomfort  Take frequent breaks and rest with your legs elevated if you have leg cramps or low back pain.  Take warm sitz baths to soothe any pain or discomfort caused by hemorrhoids. Use hemorrhoid cream if your health care provider approves.  Wear a good support bra to prevent discomfort from breast tenderness.  If you develop varicose veins: ? Wear support pantyhose or compression stockings as told by your healthcare provider. ? Elevate your feet for 15 minutes, 3-4 times a day. Prenatal care  Write down your questions. Take them to your prenatal visits.  Keep all your prenatal visits as told by  your health care provider. This is important. Safety  Wear your seat belt at all times when driving.  Make a list of emergency phone numbers, including numbers for family, friends, the hospital, and police and fire departments. General instructions  Avoid cat litter boxes and soil used by cats. These carry germs that can cause birth defects in the baby. If you have a cat, ask someone to clean the litter box for you.  Do not travel far distances unless it is absolutely necessary and only with the approval of your health care provider.  Do not use hot tubs, steam rooms, or saunas.  Do not drink alcohol.  Do not use any products that contain nicotine or tobacco, such as cigarettes and e-cigarettes. If you need help quitting, ask your health care provider.  Do not use any medicinal herbs or unprescribed drugs. These chemicals affect the formation and growth of the baby.  Do not douche or use tampons or scented sanitary pads.  Do not cross your legs for long periods of time.  To prepare for the arrival of your baby: ? Take prenatal classes to understand, practice, and ask questions about labor and delivery. ? Make a trial run to the hospital. ? Visit the hospital and tour the maternity area. ? Arrange for maternity or paternity leave through employers. ? Arrange for family and friends to take care of pets while you are in the hospital. ? Purchase a rear-facing car seat and make sure you know how to install it in your car. ? Pack your hospital bag. ? Prepare the baby's nursery. Make sure to remove all pillows and stuffed animals from the baby's crib to prevent suffocation.  Visit your dentist if you have not gone during your pregnancy. Use a soft toothbrush to brush your teeth and be gentle when you floss. Contact a health care provider if:  You are unsure if you are in labor or if your water has broken.  You become dizzy.  You have mild pelvic cramps, pelvic pressure, or nagging  pain in your abdominal area.  You have lower back pain.  You have persistent nausea, vomiting, or diarrhea.  You have an unusual or bad smelling vaginal discharge.  You have pain when you urinate. Get help right away if:  Your water breaks before 37 weeks.  You have regular contractions less than 5 minutes apart before 37 weeks.  You have a fever.  You are leaking fluid from your vagina.  You have spotting or bleeding from  your vagina.  You have severe abdominal pain or cramping.  You have rapid weight loss or weight gain.  You have shortness of breath with chest pain.  You notice sudden or extreme swelling of your face, hands, ankles, feet, or legs.  Your baby makes fewer than 10 movements in 2 hours.  You have severe headaches that do not go away when you take medicine.  You have vision changes. Summary  The third trimester is from week 28 through week 40, months 7 through 9. The third trimester is a time when the unborn baby (fetus) is growing rapidly.  During the third trimester, your discomfort may increase as you and your baby continue to gain weight. You may have abdominal, leg, and back pain, sleeping problems, and an increased need to urinate.  During the third trimester your breasts will keep growing and they will continue to become tender. A yellow fluid (colostrum) may leak from your breasts. This is the first milk you are producing for your baby.  False labor is a condition in which you feel small, irregular tightenings of the muscles in the womb (contractions) that eventually go away. These are called Braxton Hicks contractions. Contractions may last for hours, days, or even weeks before true labor sets in.  Signs of labor can include: abdominal cramps; regular contractions that start at 10 minutes apart and become stronger and more frequent with time; watery or bloody mucus discharge that comes from the vagina; increased pelvic pressure and dull back pain;  and leaking of amniotic fluid. This information is not intended to replace advice given to you by your health care provider. Make sure you discuss any questions you have with your health care provider. Document Released: 07/09/2001 Document Revised: 08/20/2016 Document Reviewed: 08/20/2016 Elsevier Interactive Patient Education  2019 [employer]

## 2018-12-12 LAB — CBC
Hematocrit: 37.7 % (ref 34.0–46.6)
Hemoglobin: 12.7 g/dL (ref 11.1–15.9)
MCH: 27.1 pg (ref 26.6–33.0)
MCHC: 33.7 g/dL (ref 31.5–35.7)
MCV: 80 fL (ref 79–97)
Platelets: 217 10*3/uL (ref 150–450)
RBC: 4.69 x10E6/uL (ref 3.77–5.28)
RDW: 14 % (ref 11.7–15.4)
WBC: 4.3 10*3/uL (ref 3.4–10.8)

## 2018-12-12 LAB — COMPREHENSIVE METABOLIC PANEL
ALT: 29 IU/L (ref 0–32)
AST: 35 IU/L (ref 0–40)
Albumin/Globulin Ratio: 1.3 (ref 1.2–2.2)
Albumin: 3.2 g/dL — ABNORMAL LOW (ref 3.8–4.8)
Alkaline Phosphatase: 110 IU/L (ref 39–117)
BUN/Creatinine Ratio: 24 — ABNORMAL HIGH (ref 9–23)
BUN: 18 mg/dL (ref 6–24)
Bilirubin Total: <0.2 mg/dL (ref 0.0–1.2)
CO2: 20 mmol/L (ref 20–29)
Calcium: 9 mg/dL (ref 8.7–10.2)
Chloride: 101 mmol/L (ref 96–106)
Creatinine, Ser: 0.75 mg/dL (ref 0.57–1.00)
GFR calc Af Amer: 114 mL/min/{1.73_m2} (ref >59)
GFR calc non Af Amer: 99 mL/min/{1.73_m2} (ref >59)
Globulin, Total: 2.4 g/dL (ref 1.5–4.5)
Glucose: 77 mg/dL (ref 65–99)
Potassium: 4.9 mmol/L (ref 3.5–5.2)
Sodium: 138 mmol/L (ref 134–144)
Total Protein: 5.6 g/dL — ABNORMAL LOW (ref 6.0–8.5)

## 2018-12-12 LAB — PROTEIN / CREATININE RATIO, URINE
Creatinine, Urine: 110.6 mg/dL
Protein, Ur: 474 mg/dL
Protein/Creat Ratio: 4286 mg/g creat — ABNORMAL HIGH (ref 0–200)

## 2018-12-14 ENCOUNTER — Telehealth: Payer: Self-pay

## 2018-12-14 NOTE — Telephone Encounter (Signed)
Called patient because babyscripts alerted for her blood pressures they are 151/91 145/92 145/85. Advised patient Via Voicemail to call the office back so that we may access her care.

## 2018-12-14 NOTE — Telephone Encounter (Addendum)
-----   Message from Hermina Staggers, MD sent at 12/14/2018  3:09 PM EDT ----- Please schedule pt for televisit BP check this week Pt also needs a 24 hr urine for prot/crt Thanks Nettie Elm  LM for pt to return our call to the office or respond to her MyChart message.

## 2018-12-15 ENCOUNTER — Other Ambulatory Visit: Payer: Self-pay

## 2018-12-15 ENCOUNTER — Encounter (HOSPITAL_COMMUNITY): Payer: Self-pay

## 2018-12-15 ENCOUNTER — Ambulatory Visit (INDEPENDENT_AMBULATORY_CARE_PROVIDER_SITE_OTHER): Payer: Self-pay

## 2018-12-15 ENCOUNTER — Inpatient Hospital Stay (HOSPITAL_COMMUNITY)
Admission: AD | Admit: 2018-12-15 | Discharge: 2018-12-24 | DRG: 786 | Disposition: A | Discharge status: Home / Self Care | Payer: No Typology Code available for payment source | Source: Ambulatory Visit | Attending: Obstetrics and Gynecology | Admitting: Obstetrics and Gynecology

## 2018-12-15 VITALS — BP 185/103

## 2018-12-15 DIAGNOSIS — Z0130 Encounter for examination of blood pressure without abnormal findings: Secondary | ICD-10-CM

## 2018-12-15 DIAGNOSIS — O9852 Other viral diseases complicating childbirth: Secondary | ICD-10-CM | POA: Diagnosis present

## 2018-12-15 DIAGNOSIS — Z3A30 30 weeks gestation of pregnancy: Secondary | ICD-10-CM | POA: Diagnosis not present

## 2018-12-15 DIAGNOSIS — O1413 Severe pre-eclampsia, third trimester: Secondary | ICD-10-CM | POA: Diagnosis not present

## 2018-12-15 DIAGNOSIS — O1414 Severe pre-eclampsia complicating childbirth: Secondary | ICD-10-CM | POA: Diagnosis present

## 2018-12-15 DIAGNOSIS — Z3A31 31 weeks gestation of pregnancy: Secondary | ICD-10-CM | POA: Diagnosis not present

## 2018-12-15 DIAGNOSIS — Z98891 History of uterine scar from previous surgery: Secondary | ICD-10-CM

## 2018-12-15 DIAGNOSIS — R7303 Prediabetes: Secondary | ICD-10-CM | POA: Diagnosis present

## 2018-12-15 DIAGNOSIS — O24424 Gestational diabetes mellitus in childbirth, insulin controlled: Secondary | ICD-10-CM | POA: Diagnosis not present

## 2018-12-15 DIAGNOSIS — U071 COVID-19: Secondary | ICD-10-CM | POA: Diagnosis present

## 2018-12-15 DIAGNOSIS — R7401 Elevation of levels of liver transaminase levels: Secondary | ICD-10-CM | POA: Diagnosis not present

## 2018-12-15 DIAGNOSIS — O09899 Supervision of other high risk pregnancies, unspecified trimester: Secondary | ICD-10-CM

## 2018-12-15 DIAGNOSIS — O09529 Supervision of elderly multigravida, unspecified trimester: Secondary | ICD-10-CM

## 2018-12-15 DIAGNOSIS — O0990 Supervision of high risk pregnancy, unspecified, unspecified trimester: Secondary | ICD-10-CM

## 2018-12-15 LAB — CBC
HCT: 41.8 % (ref 36.0–46.0)
Hemoglobin: 13.5 g/dL (ref 12.0–15.0)
MCH: 26.5 pg (ref 26.0–34.0)
MCHC: 32.3 g/dL (ref 30.0–36.0)
MCV: 82.1 fL (ref 80.0–100.0)
Platelets: 214 10*3/uL (ref 150–400)
RBC: 5.09 MIL/uL (ref 3.87–5.11)
RDW: 13.2 % (ref 11.5–15.5)
WBC: 4.2 10*3/uL (ref 4.0–10.5)
nRBC: 0 % (ref 0.0–0.2)

## 2018-12-15 LAB — COMPREHENSIVE METABOLIC PANEL
ALT: 38 U/L (ref 0–44)
AST: 44 U/L — ABNORMAL HIGH (ref 15–41)
Albumin: 2.2 g/dL — ABNORMAL LOW (ref 3.5–5.0)
Alkaline Phosphatase: 118 U/L (ref 38–126)
Anion gap: 7 (ref 5–15)
BUN: 20 mg/dL (ref 6–20)
CO2: 25 mmol/L (ref 22–32)
Calcium: 9.2 mg/dL (ref 8.9–10.3)
Chloride: 103 mmol/L (ref 98–111)
Creatinine, Ser: 0.79 mg/dL (ref 0.44–1.00)
GFR calc Af Amer: >60 mL/min (ref >60)
GFR calc non Af Amer: >60 mL/min (ref >60)
Glucose, Bld: 71 mg/dL (ref 70–99)
Potassium: 5 mmol/L (ref 3.5–5.1)
Sodium: 135 mmol/L (ref 135–145)
Total Bilirubin: 0.6 mg/dL (ref 0.3–1.2)
Total Protein: 5.3 g/dL — ABNORMAL LOW (ref 6.5–8.1)

## 2018-12-15 LAB — TYPE AND SCREEN
ABO/RH(D): O POS
Antibody Screen: NEGATIVE

## 2018-12-15 LAB — SARS CORONAVIRUS 2 BY RT PCR (HOSPITAL ORDER, PERFORMED IN ~~LOC~~ HOSPITAL LAB): SARS Coronavirus 2: POSITIVE — AB

## 2018-12-15 LAB — PROTEIN / CREATININE RATIO, URINE
Creatinine, Urine: 13.56 mg/dL
Protein Creatinine Ratio: 31.27 mg/mg{Cre} — ABNORMAL HIGH (ref 0.00–0.15)
Total Protein, Urine: 424 mg/dL

## 2018-12-15 LAB — GLUCOSE, CAPILLARY: Glucose-Capillary: 57 mg/dL — ABNORMAL LOW (ref 70–99)

## 2018-12-15 MED ORDER — ACETAMINOPHEN 325 MG PO TABS
650.0000 mg | ORAL_TABLET | ORAL | Status: DC | PRN
  Administered 2018-12-16 – 2018-12-17 (×3): 650 mg via ORAL
  Filled 2018-12-15 (×3): qty 2

## 2018-12-15 MED ORDER — LABETALOL HCL 5 MG/ML IV SOLN
40.0000 mg | INTRAVENOUS | Status: DC | PRN
  Administered 2018-12-15 – 2018-12-16 (×3): 40 mg via INTRAVENOUS
  Filled 2018-12-15 (×3): qty 8

## 2018-12-15 MED ORDER — LABETALOL HCL 5 MG/ML IV SOLN: 80.0000 mg | INTRAVENOUS | Status: DC | PRN

## 2018-12-15 MED ORDER — MAGNESIUM SULFATE BOLUS VIA INFUSION
6.0000 g | Freq: Once | INTRAVENOUS | Status: AC
  Administered 2018-12-15: 20:00:00 6 g via INTRAVENOUS
  Filled 2018-12-15: qty 500

## 2018-12-15 MED ORDER — INSULIN NPH (HUMAN) (ISOPHANE) 100 UNIT/ML ~~LOC~~ SUSP
15.0000 [IU] | Freq: Every day | SUBCUTANEOUS | Status: DC
  Administered 2018-12-15 – 2018-12-19 (×5): 15 [IU] via SUBCUTANEOUS
  Filled 2018-12-15: qty 10

## 2018-12-15 MED ORDER — CALCIUM CARBONATE ANTACID 500 MG PO CHEW: 2.0000 | CHEWABLE_TABLET | ORAL | Status: DC | PRN

## 2018-12-15 MED ORDER — INSULIN NPH (HUMAN) (ISOPHANE) 100 UNIT/ML ~~LOC~~ SUSP
40.0000 [IU] | Freq: Every day | SUBCUTANEOUS | Status: DC
  Administered 2018-12-16 – 2018-12-18 (×3): 40 [IU] via SUBCUTANEOUS
  Filled 2018-12-15: qty 10

## 2018-12-15 MED ORDER — BETAMETHASONE SOD PHOS & ACET 6 (3-3) MG/ML IJ SUSP
12.0000 mg | INTRAMUSCULAR | Status: AC
  Administered 2018-12-15 – 2018-12-16 (×2): 12 mg via INTRAMUSCULAR
  Filled 2018-12-15 (×2): qty 2

## 2018-12-15 MED ORDER — INSULIN NPH (HUMAN) (ISOPHANE) 100 UNIT/ML ~~LOC~~ SUSP: 15.0000 [IU] | Freq: Two times a day (BID) | SUBCUTANEOUS | Status: DC

## 2018-12-15 MED ORDER — LACTATED RINGERS IV SOLN
INTRAVENOUS | Status: DC
  Administered 2018-12-15 – 2018-12-22 (×4): via INTRAVENOUS

## 2018-12-15 MED ORDER — ZOLPIDEM TARTRATE 5 MG PO TABS: 5.0000 mg | ORAL_TABLET | Freq: Every evening | ORAL | Status: DC | PRN

## 2018-12-15 MED ORDER — LABETALOL HCL 5 MG/ML IV SOLN
20.0000 mg | INTRAVENOUS | Status: DC | PRN
  Administered 2018-12-15 – 2018-12-19 (×3): 20 mg via INTRAVENOUS
  Filled 2018-12-15 (×3): qty 4

## 2018-12-15 MED ORDER — MAGNESIUM SULFATE 40 G IN LACTATED RINGERS - SIMPLE
2.0000 g/h | INTRAVENOUS | Status: AC
  Administered 2018-12-15 – 2018-12-16 (×2): 2 g/h via INTRAVENOUS
  Filled 2018-12-15 (×3): qty 500

## 2018-12-15 MED ORDER — INSULIN ASPART 100 UNIT/ML ~~LOC~~ SOLN
15.0000 [IU] | Freq: Three times a day (TID) | SUBCUTANEOUS | Status: DC
  Administered 2018-12-16 – 2018-12-18 (×9): 15 [IU] via SUBCUTANEOUS

## 2018-12-15 MED ORDER — PRENATAL MULTIVITAMIN CH
1.0000 | ORAL_TABLET | Freq: Every day | ORAL | Status: DC
  Administered 2018-12-16 – 2018-12-21 (×6): 1 via ORAL
  Filled 2018-12-15 (×6): qty 1

## 2018-12-15 MED ORDER — INSULIN ASPART 100 UNIT/ML ~~LOC~~ SOLN: 0.0000 [IU] | Freq: Three times a day (TID) | SUBCUTANEOUS | Status: DC

## 2018-12-15 MED ORDER — CYCLOBENZAPRINE HCL 10 MG PO TABS
10.0000 mg | ORAL_TABLET | Freq: Three times a day (TID) | ORAL | Status: DC | PRN
  Administered 2018-12-15 – 2018-12-17 (×3): 10 mg via ORAL
  Filled 2018-12-15 (×3): qty 1

## 2018-12-15 MED ORDER — DOCUSATE SODIUM 100 MG PO CAPS
100.0000 mg | ORAL_CAPSULE | Freq: Every day | ORAL | Status: DC
  Administered 2018-12-16 – 2018-12-21 (×4): 100 mg via ORAL
  Filled 2018-12-15 (×6): qty 1

## 2018-12-15 MED ORDER — HYDRALAZINE HCL 20 MG/ML IJ SOLN
10.0000 mg | INTRAMUSCULAR | Status: DC | PRN
  Filled 2018-12-15: qty 1

## 2018-12-15 NOTE — Progress Notes (Signed)
Gave pt apple juice for CBG 57. Provider aware.

## 2018-12-15 NOTE — Progress Notes (Signed)
Pt here today for BP check and to have explained how to obtain a 24 hr urine.  Pt denies headache or blurred vision.  Observed pt with +2 pitting edema in ankles and swelling in hands.  BP 185/103 RA.  Lab technician explained to pt how to obtain 24 hr urine collection.  Notified Dr. Marice Potter who recommends that pt go to MAU for evaluation.  Notified pt provider's recommendation.  Pt agreed.  MAU notified about pt's arrival.

## 2018-12-15 NOTE — MAU Note (Signed)
Sent over because her 'protein level wasn't right', and BP was up. Never had high blood pressure before.  Feels like she is getting a migraine from all the stress- denies pain at this time.  Denies visual changes, epigastric, reports increased swelling in feet and ankles.

## 2018-12-15 NOTE — Consult Note (Signed)
Neonatology Consult  Note:  At the request of the patients obstetrician Dr. Nehemiah Settle I met via a phone consult with Rejeana Fadness who is a 43 y.o. [phone] with IUP at 47w5dpresenting for severe preeclampsia. Pregnancy complicated by insulin controlled GDM, AMA, & late to prenatal care.  She is being managed with magnesium sulfate for seizure prophylaxis, IV antihypertensive protocol and antenatal corticosteroids.  Routine COVID-19 testing on admission resulted as positive. Patient is currently asymptomatic.       We reviewed initial delivery room management, including CPAP, Lennox, and low but certainly possible need for intubation for surfactant administration.  We discussed feeding immaturity and need for full po intake with multiple days of good weight gain and no apnea or bradycardia before discharge.  We reviewed increased risk of jaundice, infection, and temperature instability.    Discussed likely length of stay. Thank you for allowing [facility] participate in her care.  Please call with questions.  BHiginio Roger DO  Neonatologist  The total length of face-to-face or floor / unit time for this encounter was 30 minutes.  Counseling and / or coordination of care was greater than fifty percent of the time.

## 2018-12-15 NOTE — MAU Note (Signed)
Called pt to triage, had to use restroom first

## 2018-12-15 NOTE — Plan of Care (Signed)
  Problem: Education: Goal: Knowledge of General Education information will improve Description Including pain rating scale, medication(s)/side effects and non-pharmacologic comfort measures Outcome: Completed/Met

## 2018-12-15 NOTE — Telephone Encounter (Signed)
Attempted to contact pt regarding scheduling her for a BP check this week as well as for her to come in for a 24hr urine for prot/cret per Dr. Alysia Penna.  Pt did not pick up.  Left message to call the clinic and also advised her that a mychart message would be sent.

## 2018-12-15 NOTE — Progress Notes (Signed)
Patient ID: Lisa Webb, female   DOB: May 22, 1976, 43 y.o.   MRN: [id number]  Notified by nurse of patient's positive COVID-19 test. Patient is currently asymptomatic. I called the patient and notified her of the test results - currently, no action needed, other than contact precautions (droplet for aerosolized procedures). Discussed possible progression of disease and asked her to notify us of any changes in symptoms. Discussed possible need for cesarean delivery depending on clinical scenario. Reaffirmed to patient that delivery was not indicated at this moment. All questions answered.   NICU, L&D, and anesthesia teams aware. NICU consult placed.   Chart reviewed and messages sent to recent exposures, including office and MAU staff.  Currently, patient on magnesium infusion and will need to remain on continuous monitoring.   If patient begins to be symptomatic with fever, cough and/or SOB, would recommend:   CXR  ESR   CRP  DDimer  CBC   Supplemental oxygen  Consider blood cultures and lactic acid  Truett Mainland, DO 12/15/2018 10:40 PM

## 2018-12-15 NOTE — MAU Note (Signed)
Pt is a G5P3 at 30.5 days with elevated BP at the MD office today.

## 2018-12-15 NOTE — H&P (Signed)
Obstetric History and Physical  Lisa Webb is a 43 y.o. [phone] with IUP at [redacted]w[redacted]d presenting for severe preeclampsia. Had new onset hypertension during her prenatal appointment on 5/15. Had labs done that day. Urine protein creatinine ratio was elevated at over 4000. Pt was in office today for BP check & to start 24 hour urine. BP in the office was 180s/100.  Presented to MAU & continues to have severe range BPs. Patient is asymptomatic at this time.  This pregnancy complicated by insulin controlled GDM, AMA, & late to prenatal care.    Prenatal Course Source of Care: [facility]  with onset of care at 26 weeks Pregnancy complications or risks: Patient Active Problem List   Diagnosis Date Noted  . GDM (gestational diabetes mellitus) 11/25/2018  . Glucosuria 11/18/2018  . Supervision of high risk pregnancy, antepartum 10/30/2018  . Late prenatal care, antepartum 10/30/2018  . Advanced maternal age in multigravida 10/30/2018  . History of preterm delivery, currently pregnant 10/30/2018    Prenatal labs and studies: ABO, Rh: O/Positive/-- (04/22 1150) Antibody: Negative (04/22 1150) Rubella: <0.90 (04/22 1150) RPR: Non Reactive (04/22 1150)  HBsAg: Negative (04/22 1150)  HIV: Non Reactive (04/22 1150)  GBS: n/a 2hr GTT:  A1C=8.0 on 4/22 Genetic screening normal Anatomy US normal   Past Medical History:  Diagnosis Date  . Abnormal uterine bleeding 2018  . Hernia, abdominal 2007  . Migraines    pt does not take meds  . Rectal bleeding 2018    Past Surgical History:  Procedure Laterality Date  . COLONOSCOPY  2018   rectal bleeding, Fam Hx of colon Ca  . ENDOMETRIAL BIOPSY  2018   normal  . HERNIA REPAIR  2007    OB History  Gravida Para Term Preterm AB Living  5 3 2 1 1 3   SAB TAB Ectopic Multiple Live Births  1       3    # Outcome Date GA Lbr Len/2nd Weight Sex Delivery Anes PTL Lv  5 Current           4 Term 04/03/06 [redacted]w[redacted]d  3714 g F Vag-Spont EPI N LIV  3 SAB  02/14/05 [redacted]w[redacted]d         2 Term 09/19/99 [redacted]w[redacted]d  3345 g F Vag-Spont None N LIV  1 Preterm 09/08/94   2013 g M Vag-Spont None N LIV    Social History   Socioeconomic History  . Marital status: Single    Spouse name: Not on file  . Number of children: Not on file  . Years of education: Not on file  . Highest education level: Not on file  Occupational History    Employer: [employer]  Social Needs  . Financial resource strain: Not on file  . Food insecurity:    Worry: Never true    Inability: Never true  . Transportation needs:    Medical: No    Non-medical: No  Tobacco Use  . Smoking status: Never Smoker  . Smokeless tobacco: Never Used  Substance and Sexual Activity  . Alcohol use: Not Currently  . Drug use: Never  . Sexual activity: Not Currently  Lifestyle  . Physical activity:    Days per week: Not on file    Minutes per session: Not on file  . Stress: Not on file  Relationships  . Social connections:    Talks on phone: Not on file    Gets together: Not on file    Attends religious  service: Not on file    Active member of club or organization: Not on file    Attends meetings of clubs or organizations: Not on file    Relationship status: Not on file  Other Topics Concern  . Not on file  Social History Narrative  . Not on file    Family History  Problem Relation Age of Onset  . Cancer Mother        colon  . Hypertension Father   . Dementia Father   . Diabetes Sister   . Thyroid disease Sister     Medications Prior to Admission  Medication Sig Dispense Refill Last Dose  . Calcium-Vitamin D-Vitamin K (CALCIUM SOFT CHEWS PO) Take 1 tablet by mouth daily.   Taking  . insulin NPH Human (NOVOLIN N) 100 UNIT/ML injection 40 units @ breakfast; 15 units @ bedtime. 10 mL 5   . insulin regular (NOVOLIN R) 100 units/mL injection 20 units @ breakfast; 15 units @ dinner 10 mL 5   . Insulin Syringe-Needle U-100 31G X 5/16" 1 ML MISC 1 Syringe by Does not apply  route 4 (four) times daily. Check your blood sugar 4x/day: am fasting, 2 hours after breakfast/lunch/dinner 100 each 5   . Prenatal Vit-Fe Fumarate-FA (PRENATAL VITAMINS) 28-0.8 MG TABS Take by mouth.   Taking    No Known Allergies  Review of Systems: Negative except for what is mentioned in HPI.  Physical Exam: Patient Vitals for the past 24 hrs:  BP Temp Temp src Pulse Resp SpO2 Weight  12/15/18 1846 (!) 174/105 - - 78 - - -  12/15/18 1841 - 98.6 F (37 C) Oral - - - -  12/15/18 1840 (!) 173/108 - - 80 - - -  12/15/18 1813 (!) 165/108 98.1 F (36.7 C) Oral 78 18 100 % 111.3 kg    CONSTITUTIONAL: Well-developed, well-nourished female in no acute distress.  HENT:  Normocephalic, atraumatic, External right and left ear normal. Oropharynx is clear and moist EYES: Conjunctivae and EOM are normal. Pupils are equal, round, and reactive to light. No scleral icterus.  NECK: Normal range of motion, supple, no masses SKIN: Skin is warm and dry. No rash noted. Not diaphoretic. No erythema. No pallor. NEUROLOGIC: Alert and oriented to person, place, and time. Normal reflexes, muscle tone coordination. No cranial nerve deficit noted. Normal DTRs, no clonus PSYCHIATRIC: Normal mood and affect. Normal behavior. Normal judgment and thought content. CARDIOVASCULAR: Normal heart rate noted, regular rhythm RESPIRATORY: Effort and breath sounds normal, no problems with respiration noted ABDOMEN: Soft, nontender, nondistended, gravid. MUSCULOSKELETAL: Normal range of motion. 2+ pitting edema of BLE. 2+ distal pulses.  FHT:  NST:  Baseline: 135 bpm, Variability: Good {> 6 bpm), Accelerations: Reactive and Decelerations: Absent   Pertinent Labs/Studies:   No results found for this or any previous visit (from the past 24 hour(s)).  Assessment : Lisa Webb is a 43 y.o. [phone] at 6667w5d being admitted for severe preeclampsia.   Plan: Admit to [facility] (NICU aware) Mag sulfate for seizure  prophylaxis IV antihypertensive protocol being started in MAU Antenatal corticosteroids  Judeth HornLawrence, Deavion Strider, NP

## 2018-12-16 DIAGNOSIS — O1413 Severe pre-eclampsia, third trimester: Secondary | ICD-10-CM

## 2018-12-16 DIAGNOSIS — Z3A31 31 weeks gestation of pregnancy: Secondary | ICD-10-CM

## 2018-12-16 LAB — GLUCOSE, CAPILLARY
Glucose-Capillary: 102 mg/dL — ABNORMAL HIGH (ref 70–99)
Glucose-Capillary: 103 mg/dL — ABNORMAL HIGH (ref 70–99)
Glucose-Capillary: 125 mg/dL — ABNORMAL HIGH (ref 70–99)
Glucose-Capillary: 173 mg/dL — ABNORMAL HIGH (ref 70–99)
Glucose-Capillary: 92 mg/dL (ref 70–99)

## 2018-12-16 MED ORDER — LACTATED RINGERS IV BOLUS
500.0000 mL | Freq: Once | INTRAVENOUS | Status: AC
  Administered 2018-12-16: 19:00:00 via INTRAVENOUS

## 2018-12-16 MED ORDER — INSULIN ASPART 100 UNIT/ML ~~LOC~~ SOLN
0.0000 [IU] | Freq: Three times a day (TID) | SUBCUTANEOUS | Status: DC
  Administered 2018-12-16: 15:00:00 8 [IU] via SUBCUTANEOUS
  Administered 2018-12-16: 5 [IU] via SUBCUTANEOUS
  Administered 2018-12-16 – 2018-12-17 (×2): 4 [IU] via SUBCUTANEOUS
  Administered 2018-12-17: 15:00:00 5 [IU] via SUBCUTANEOUS
  Administered 2018-12-17: 4 [IU] via SUBCUTANEOUS
  Administered 2018-12-19: 0 [IU] via SUBCUTANEOUS

## 2018-12-16 MED ORDER — OXYCODONE-ACETAMINOPHEN 5-325 MG PO TABS
2.0000 | ORAL_TABLET | Freq: Once | ORAL | Status: AC
  Administered 2018-12-16: 2 via ORAL
  Filled 2018-12-16: qty 2

## 2018-12-16 MED ORDER — OXYCODONE-ACETAMINOPHEN 5-325 MG PO TABS
1.0000 | ORAL_TABLET | Freq: Once | ORAL | Status: AC
  Administered 2018-12-16: 1 via ORAL
  Filled 2018-12-16: qty 1

## 2018-12-16 MED ORDER — LABETALOL HCL 200 MG PO TABS
400.0000 mg | ORAL_TABLET | Freq: Two times a day (BID) | ORAL | Status: DC
  Administered 2018-12-16 – 2018-12-19 (×8): 400 mg via ORAL
  Filled 2018-12-16 (×8): qty 2

## 2018-12-16 NOTE — Progress Notes (Signed)
Initial Nutrition Assessment  DOCUMENTATION CODES:  Morbid obesity  INTERVENTION:  Carbohydrate modified gestational diet NUTRITION DIAGNOSIS:  Altered nutrition lab value related to (pregnancy and gestational diabetes) as evidenced by (serum glucose ).  GOAL:  Patient will meet greater than or equal to 90% of their needs MONITOR:  Labs  REASON FOR ASSESSMENT:  Antenatal, Gestational Diabetes   ASSESSMENT:  30 6/7 weeks adm with preeclampsia. GDM. 110 kg at initial PNV at 26 weeks. BMI 43   22 Lb weight gain over the past month.  Diet Order:   Diet Order            Diet gestational carb mod Room service appropriate? Yes; Fluid consistency: Thin  Diet effective now             EDUCATION NEEDS:  Education needs have been addressed(outpt diet educ 11/26/18, insulin educ 12/01/18)  Skin:  Skin Assessment: Reviewed RN Assessment  Height:   Ht Readings from Last 1 Encounters:  12/15/18 5\' 3"  (1.6 m)    Weight:   Wt Readings from Last 1 Encounters:  12/15/18 111.3 kg    Ideal Body Weight:   115 lbs  BMI:  Body mass index is 43.45 kg/m.  Estimated Nutritional Needs:   Kcal:  2200-2400  Protein:  98-108 g  Fluid:  2.5 L    Elisabeth Cara M.Odis Luster LDN Neonatal Nutrition Support Specialist/RD III Pager [phone]      Phone [phone]

## 2018-12-16 NOTE — Progress Notes (Signed)
Maternal-Fetal Medicine  Ms. Lisa Webb, G5 P3 at 30-weeks' gestation, is admitted with the diagnosis of preeclampsia with severe features. Ultrasound was requested for fetal growth assessment. Patient tested positive for COVID-19 and she is asymptomatic.  I recommend we defer fetal growth assessment till next week to minimize contact with  Healthcare professionals. Growth assessment is unlikely to influence management now.  I reviewed NST, which is reassuring. If NST is not reassuring, BPP may be performed.

## 2018-12-16 NOTE — Progress Notes (Signed)
Dr Alysia Penna notified that FHR baseline has decreased from 120/125 bpm to 115 bpm. MD has no new orders and states that pt can be taken off of monitor at this time.

## 2018-12-16 NOTE — Progress Notes (Signed)
FACULTY PRACTICE ANTEPARTUM(COMPREHENSIVE) NOTE  Zayana Manago is a 43 y.o. [phone] at [redacted]w[redacted]d by best clinical estimate who is admitted for pre-eclampsia with severe features, by BP.   Fetal presentation is unsure. Length of Stay:  1  Days  Subjective: Lingering headache. Has long h/o headaches, migraine, tension and sinus headaches.This is not new. She is also COVID + and works for [employer], may also be responsible for headache. CBGs are up secondary to BMZ. Labs are stable. Patient reports the fetal movement as active. Patient reports uterine contraction  activity as none. Patient reports  vaginal bleeding as none. Patient describes fluid per vagina as none  Vitals:  Blood pressure 140/82, pulse 76, temperature (!) 97.2 F (36.2 C), temperature source Oral, resp. rate 16, height 5\' 3"  (1.6 m), weight 111.3 kg, last menstrual period 05/12/2018, SpO2 95 %. Physical Examination:  General appearance - alert, well appearing, and in no distress Chest - normal effort Abdomen - gravid, non-tender Fundal Height:  size equals dates Extremities: Homans sign is negative, no sign of DVT  Membranes:intact  Fetal Monitoring:  Baseline: 125 bpm, Variability: Good {> 6 bpm), Accelerations: Reactive and Decelerations: Absent  Labs:  Results for orders placed or performed during the hospital encounter of 12/15/18 (from the past 24 hour(s))  Protein / creatinine ratio, urine   Collection Time: 12/15/18  6:51 PM  Result Value Ref Range   Creatinine, Urine 13.56 mg/dL   Total Protein, Urine 424 mg/dL   Protein Creatinine Ratio 31.27 (H) 0.00 - 0.15 mg/mg[Cre]  CBC   Collection Time: 12/15/18  7:26 PM  Result Value Ref Range   WBC 4.2 4.0 - 10.5 K/uL   RBC 5.09 3.87 - 5.11 MIL/uL   Hemoglobin 13.5 12.0 - 15.0 g/dL   HCT 23.5 57.3 - 22.0 %   MCV 82.1 80.0 - 100.0 fL   MCH 26.5 26.0 - 34.0 pg   MCHC 32.3 30.0 - 36.0 g/dL   RDW 25.4 27.0 - 62.3 %   Platelets 214 150 - 400 K/uL   nRBC 0.0 0.0 - 0.2  %  Type and screen [facility]   Collection Time: 12/15/18  7:26 PM  Result Value Ref Range   ABO/RH(D) O POS    Antibody Screen NEG    Sample Expiration      12/18/2018,2359 Performed at [facility], [address] [place], [place] [address]   ABO/Rh   Collection Time: 12/15/18  7:26 PM  Result Value Ref Range   ABO/RH(D)      O POS Performed at [facility], [address] [place], [place] [address]   SARS Coronavirus 2 (CEPHEID - Performed in [facility] hospital lab), [facility] Order   Collection Time: 12/15/18  7:45 PM  Result Value Ref Range   SARS Coronavirus 2 POSITIVE (A) NEGATIVE  Culture, beta strep (group b only)   Collection Time: 12/15/18  8:26 PM  Result Value Ref Range   Specimen Description VAGINAL/RECTAL    Special Requests NONE    Culture      TOO YOUNG TO READ Performed at [facility], [address] [place], [place] [address]    Report Status PENDING   Glucose, capillary   Collection Time: 12/15/18  8:39 PM  Result Value Ref Range   Glucose-Capillary 57 (L) 70 - 99 mg/dL  Comprehensive metabolic panel   Collection Time: 12/15/18  8:46 PM  Result Value Ref Range   Sodium 135  135 - 145 mmol/L   Potassium 5.0 3.5 - 5.1 mmol/L   Chloride 103 98 - 111 mmol/L   CO2 25 22 - 32 mmol/L   Glucose, Bld 71 70 - 99 mg/dL   BUN 20 6 - 20 mg/dL   Creatinine, Ser 1.610.79 0.44 - 1.00 mg/dL   Calcium 9.2 8.9 - 09.610.3 mg/dL   Total Protein 5.3 (L) 6.5 - 8.1 g/dL   Albumin 2.2 (L) 3.5 - 5.0 g/dL   AST 44 (H) 15 - 41 U/L   ALT 38 0 - 44 U/L   Alkaline Phosphatase 118 38 - 126 U/L   Total Bilirubin 0.6 0.3 - 1.2 mg/dL   GFR calc non Af Amer >60 >60 mL/min   GFR calc Af Amer >60 >60 mL/min   Anion gap 7 5 - 15  Glucose, capillary   Collection Time: 12/16/18 12:33 AM  Result Value Ref Range   Glucose-Capillary 103 (H) 70 - 99 mg/dL  Glucose, capillary   Collection Time: 12/16/18  8:48 AM  Result Value Ref Range    Glucose-Capillary 92 70 - 99 mg/dL  Glucose, capillary   Collection Time: 12/16/18 10:52 AM  Result Value Ref Range   Glucose-Capillary 125 (H) 70 - 99 mg/dL  Glucose, capillary   Collection Time: 12/16/18  3:06 PM  Result Value Ref Range   Glucose-Capillary 173 (H) 70 - 99 mg/dL    Medications:  Scheduled . betamethasone acetate-betamethasone sodium phosphate  12 mg Intramuscular Q24H  . docusate sodium  100 mg Oral Daily  . insulin aspart  0-32 Units Subcutaneous TID PC  . insulin aspart  15 Units Subcutaneous TID WC  . insulin NPH Human  40 Units Subcutaneous QAC breakfast   And  . insulin NPH Human  15 Units Subcutaneous QHS  . labetalol  400 mg Oral BID  . prenatal multivitamin  1 tablet Oral Q1200   I have reviewed the patient's current medications.  ASSESSMENT: Principal Problem:   Preeclampsia, severe, third trimester Active Problems:   Advanced maternal age in multigravida   History of preterm delivery, currently pregnant   GDM (gestational diabetes mellitus)   COVID-19 virus detected   Severe preeclampsia   PLAN: Watch CBGs, increase insulin if needed, on SSI also 2nd BMZ today Mag off tomorrow evening  If HA is controlled, may watch and try to keep BP under control. Advised pt. To notify her job about her COVID testing. Advised pt. That her daughter and partner need to self-quarantine x 14 days. Will discuss getting testing for them tomorrow. Percocet for headache today.  Reva Boresanya S Trevaris Pennella, MD 12/16/2018,4:38 PM

## 2018-12-16 NOTE — Plan of Care (Signed)
  Problem: Respiratory: Goal: Will maintain a patent airway Outcome: Progressing Note:  Pt asymptomatic to COVID-19. SPO2 96 on room air

## 2018-12-16 NOTE — Progress Notes (Signed)
I have reviewed this chart and agree with the RN/CMA assessment and management.    Berneda Piccininni C Trea Carnegie, MD, FACOG Attending Physician, [facility]  

## 2018-12-17 LAB — GLUCOSE, CAPILLARY
Glucose-Capillary: 96 mg/dL (ref 70–99)
Glucose-Capillary: 110 mg/dL — ABNORMAL HIGH (ref 70–99)
Glucose-Capillary: 128 mg/dL — ABNORMAL HIGH (ref 70–99)
Glucose-Capillary: 104 mg/dL — ABNORMAL HIGH (ref 70–99)

## 2018-12-17 LAB — CULTURE, BETA STREP (GROUP B ONLY)

## 2018-12-17 LAB — OB RESULTS CONSOLE GBS: GBS: POSITIVE

## 2018-12-17 MED ORDER — MENTHOL 3 MG MT LOZG
1.0000 | LOZENGE | OROMUCOSAL | Status: DC | PRN
  Filled 2018-12-17: qty 9

## 2018-12-17 NOTE — Progress Notes (Signed)
Patient ID: Lisa Webb, female   DOB: 05/07/1976, 43 y.o.   MRN: [id number] FACULTY PRACTICE ANTEPARTUM(COMPREHENSIVE) NOTE  Lisa Webb is a 43 y.o. [id number] at [redacted]w[redacted]d by best clinical estimate who is admitted for pre-eclampsia with severe features due to BP.   Fetal presentation is unsure. Length of Stay:  2  Days  Subjective: She is feeling better and reports no headache right now that her Magnesium is off. Patient reports the fetal movement as active. Patient reports uterine contraction  activity as none. Patient reports  vaginal bleeding as none. Patient describes fluid per vagina as None.  Vitals:  Blood pressure 136/80, pulse 70, temperature 98.2 F (36.8 C), temperature source Oral, resp. rate 16, height 5\' 3"  (1.6 m), weight 111.3 kg, last menstrual period 05/12/2018, SpO2 96 %. Physical Examination:  General appearance - alert, well appearing, and in no distress Chest - normal effort Abdomen - gravid, non-tender Fundal Height:  size equals dates Extremities: Homans sign is negative, no sign of DVT  Membranes:intact  Fetal Monitoring:  Baseline: 120 bpm, Variability: Good {> 6 bpm), Accelerations: Reactive and Decelerations: Absent  Labs:  Results for orders placed or performed during the hospital encounter of 12/15/18 (from the past 24 hour(s))  Glucose, capillary   Collection Time: 12/16/18  3:06 PM  Result Value Ref Range   Glucose-Capillary 173 (H) 70 - 99 mg/dL  Glucose, capillary   Collection Time: 12/16/18  9:51 PM  Result Value Ref Range   Glucose-Capillary 102 (H) 70 - 99 mg/dL  Glucose, capillary   Collection Time: 12/17/18  7:55 AM  Result Value Ref Range   Glucose-Capillary 96 70 - 99 mg/dL  Glucose, capillary   Collection Time: 12/17/18 11:11 AM  Result Value Ref Range   Glucose-Capillary 110 (H) 70 - 99 mg/dL    Medications:  Scheduled . docusate sodium  100 mg Oral Daily  . insulin aspart  0-32 Units Subcutaneous TID PC  . insulin aspart  15  Units Subcutaneous TID WC  . insulin NPH Human  40 Units Subcutaneous QAC breakfast   And  . insulin NPH Human  15 Units Subcutaneous QHS  . labetalol  400 mg Oral BID  . prenatal multivitamin  1 tablet Oral Q1200   I have reviewed the patient's current medications.  ASSESSMENT: Principal Problem:   Preeclampsia, severe, third trimester Active Problems:   Advanced maternal age in multigravida   History of preterm delivery, currently pregnant   GDM (gestational diabetes mellitus)   COVID-19 virus detected   Severe preeclampsia   PLAN: S/p Mag and BMZ x 2. BP is ok for now Will continue to watch COVID pos--no symptoms.  Reva Bores, MD 12/17/2018,11:27 AM

## 2018-12-18 LAB — GLUCOSE, CAPILLARY
Glucose-Capillary: 89 mg/dL (ref 70–99)
Glucose-Capillary: 76 mg/dL (ref 70–99)
Glucose-Capillary: 79 mg/dL (ref 70–99)
Glucose-Capillary: 104 mg/dL — ABNORMAL HIGH (ref 70–99)

## 2018-12-18 LAB — ABO/RH: ABO/RH(D): O POS

## 2018-12-18 MED ORDER — GUAIFENESIN ER 600 MG PO TB12
600.0000 mg | ORAL_TABLET | Freq: Two times a day (BID) | ORAL | Status: DC
  Administered 2018-12-18 – 2018-12-21 (×7): 600 mg via ORAL
  Filled 2018-12-18 (×9): qty 1

## 2018-12-18 NOTE — Progress Notes (Signed)
Per infection prevention- N95 masks recommended in addiction to contact and droplet precautions at this time due to pt cough.

## 2018-12-18 NOTE — Progress Notes (Signed)
Patient ID: Lisa Webb, female   DOB: May 13, 1976, 43 y.o.   MRN: [id number] FACULTY PRACTICE ANTEPARTUM(COMPREHENSIVE) NOTE  Lisa Webb is a 43 y.o. [id number] at [redacted]w[redacted]d by best clinical estimate who is admitted for pre-eclampsia.   Fetal presentation is unsure. Length of Stay:  3  Days  Subjective: New cough. CBGs are well controlled. Denies h/a. Patient reports the fetal movement as active. Patient reports uterine contraction  activity as none. Patient reports  vaginal bleeding as none. Patient describes fluid per vagina as None.  Vitals:  Blood pressure (!) 148/92, pulse 67, temperature 98.9 F (37.2 C), temperature source Axillary, resp. rate 18, height 5\' 3"  (1.6 m), weight 111.3 kg, last menstrual period 05/12/2018, SpO2 100 %. Physical Examination:  General appearance - alert, well appearing, and in no distress Chest - normal effort Abdomen - gravid, non-tender Fundal Height:  size equals dates Extremities: Homans sign is negative, no sign of DVT  Membranes:intact  Fetal Monitoring:  Baseline: 135 bpm, Variability: Good {> 6 bpm), Accelerations: Reactive and Decelerations: Absent  Labs:  Results for orders placed or performed during the hospital encounter of 12/15/18 (from the past 24 hour(s))  Glucose, capillary   Collection Time: 12/17/18  9:01 PM  Result Value Ref Range   Glucose-Capillary 104 (H) 70 - 99 mg/dL  Glucose, capillary   Collection Time: 12/18/18  8:44 AM  Result Value Ref Range   Glucose-Capillary 89 70 - 99 mg/dL  Glucose, capillary   Collection Time: 12/18/18 10:51 AM  Result Value Ref Range   Glucose-Capillary 76 70 - 99 mg/dL  Glucose, capillary   Collection Time: 12/18/18  4:28 PM  Result Value Ref Range   Glucose-Capillary 79 70 - 99 mg/dL    Medications:  Scheduled . docusate sodium  100 mg Oral Daily  . insulin aspart  0-32 Units Subcutaneous TID PC  . insulin aspart  15 Units Subcutaneous TID WC  . insulin NPH Human  40 Units  Subcutaneous QAC breakfast   And  . insulin NPH Human  15 Units Subcutaneous QHS  . labetalol  400 mg Oral BID  . prenatal multivitamin  1 tablet Oral Q1200   I have reviewed the patient's current medications.  ASSESSMENT: Principal Problem:   Preeclampsia, severe, third trimester Active Problems:   Advanced maternal age in multigravida   History of preterm delivery, currently pregnant   GDM (gestational diabetes mellitus)   COVID-19 virus detected   Severe preeclampsia   PLAN: Add guaifenesin COVID precautions--discussed delivery plan Labetalol has gotten her BPs well controlled Insulin has CBGs well controlled.  Reva Bores, MD 12/18/2018,5:44 PM

## 2018-12-19 LAB — GLUCOSE, CAPILLARY
Glucose-Capillary: 106 mg/dL — ABNORMAL HIGH (ref 70–99)
Glucose-Capillary: 24 mg/dL — CL (ref 70–99)
Glucose-Capillary: 66 mg/dL — ABNORMAL LOW (ref 70–99)
Glucose-Capillary: 94 mg/dL (ref 70–99)
Glucose-Capillary: 66 mg/dL — ABNORMAL LOW (ref 70–99)
Glucose-Capillary: 87 mg/dL (ref 70–99)
Glucose-Capillary: 62 mg/dL — ABNORMAL LOW (ref 70–99)
Glucose-Capillary: 73 mg/dL (ref 70–99)
Glucose-Capillary: 95 mg/dL (ref 70–99)
Glucose-Capillary: 49 mg/dL — ABNORMAL LOW (ref 70–99)
Glucose-Capillary: 72 mg/dL (ref 70–99)
Glucose-Capillary: 68 mg/dL — ABNORMAL LOW (ref 70–99)
Glucose-Capillary: 82 mg/dL (ref 70–99)

## 2018-12-19 MED ORDER — INSULIN NPH (HUMAN) (ISOPHANE) 100 UNIT/ML ~~LOC~~ SUSP
10.0000 [IU] | Freq: Every day | SUBCUTANEOUS | Status: DC
  Filled 2018-12-19: qty 10

## 2018-12-19 MED ORDER — LABETALOL HCL 200 MG PO TABS
600.0000 mg | ORAL_TABLET | Freq: Two times a day (BID) | ORAL | Status: DC
  Administered 2018-12-19 – 2018-12-20 (×3): 600 mg via ORAL
  Filled 2018-12-19 (×3): qty 3

## 2018-12-19 MED ORDER — INSULIN NPH (HUMAN) (ISOPHANE) 100 UNIT/ML ~~LOC~~ SUSP
40.0000 [IU] | Freq: Every day | SUBCUTANEOUS | Status: DC
  Administered 2018-12-19: 40 [IU] via SUBCUTANEOUS
  Filled 2018-12-19: qty 10

## 2018-12-19 NOTE — Progress Notes (Signed)
CBG post juice = 72.  Please note pt. Is very hard stick, took three attempts to get blood for CBG (reason for delay). Pt. Remains A&O x 4

## 2018-12-19 NOTE — Progress Notes (Signed)
Pt. Due for follow up BP at 10:10 however said "we are always bothering her and she likes to eat in peace. BP taken when pt. Done eating and in agreement.

## 2018-12-19 NOTE — Progress Notes (Signed)
Pt called out for a nurse . Rn went into pt room around 0200 and pt was coming back from bathroom looking confused. She asked for a snack and Rn helped pt to bed and got her  Coke and gram crackers. Pt was sweaty and had slurr speech. Rn took a cbg at [employer] and it was 24. Another Rn was called and Dr Despina Hidden was notified. Orders was given. Pt was given  A can of coke and 2 grams crackers and peanut butter and also 2 cups of apple juice. cbg was checked again at 0227 and it was 106. Pt at this time is more alert and asking Rn questions. Pt was also able to answer all questions appropriately. Rechecked cbg again at 0245 and it was 66. Gave her one more cup of juice and a gram cracker. Rechecked at 0310 and was 94. Dr Despina Hidden to be notified. Will continue to watch pt closely.

## 2018-12-19 NOTE — Progress Notes (Signed)
Pt alert and oriented x 4. Answering questions appropriately no signs of hypoglycemic episode.

## 2018-12-19 NOTE — Progress Notes (Addendum)
Pt. Eats meals much later than scheduled time. Pt's lunch 2 hr. PP CBG = 95. Upon recheck pt's CBG was 49. Pt.'s SS and NPH (dinner dose) held. Pt. Expressed to nurse that we are giving her too much insulin and her sugars are never this low or sparadic when she manages.  Hypoglycemia protocol imitated for second time on my shift. 4 oz of juice and pretzels given.  Stayed in room with pt. During hypoglycemic event.  Pt. Did not show s/s of hypoglycemia and actively participated in conversation with RN regarding her home regimen. A&Ox4

## 2018-12-19 NOTE — Progress Notes (Signed)
Patient ID: Lisa Webb, female   DOB: August 10, 1975, 43 y.o.   MRN: [id number] FACULTY PRACTICE ANTEPARTUM(COMPREHENSIVE) NOTE  Lisa Webb is a 43 y.o. [id number] with Estimated Date of Delivery: 02/18/19   By   [redacted]w[redacted]d  who is admitted for severe range BP with dramatically elevated Pr/Cr ratio   Fetal presentation is unsure. Length of Stay:  4  Days  Date of admission:12/15/2018  Subjective: No headache Good FM Feels better since her hypoglycemic episode Patient reports the fetal movement as active. Patient reports uterine contraction  activity as none. Patient reports  vaginal bleeding as none. Patient describes fluid per vagina as None.  Vitals:  Blood pressure (!) 148/87, pulse 61, temperature 98 F (36.7 C), resp. rate 18, height 5\' 3"  (1.6 m), weight 111.3 kg, last menstrual period 05/12/2018, SpO2 97 %. Vitals:   12/18/18 1809 12/18/18 1810 12/18/18 2133 12/19/18 0252  BP:  (!) 153/86 (!) 144/89 (!) 148/87  Pulse: 67 71 67 61  Resp: 18  18 18   Temp: 98.1 F (36.7 C)  98 F (36.7 C) 98 F (36.7 C)  TempSrc: Axillary     SpO2: 99%  97%   Weight:      Height:       Physical Examination:  General appearance - alert, well appearing, and in no distress Fundal Height:  size equals dates Pelvic Exam:  examination not indicated Cervical Exam: Not evaluated. Extremities: extremities normal, atraumatic, no cyanosis or edema with DTRs 2+ bilaterally Membranes:intact  Fetal Monitoring:  Baseline: 140 bpm, Variability: Good {> 6 bpm), Accelerations: Reactive and Decelerations: Absent   reactive  Labs:  Results for orders placed or performed during the hospital encounter of 12/15/18 (from the past 24 hour(s))  Glucose, capillary   Collection Time: 12/18/18  8:44 AM  Result Value Ref Range   Glucose-Capillary 89 70 - 99 mg/dL  Glucose, capillary   Collection Time: 12/18/18 10:51 AM  Result Value Ref Range   Glucose-Capillary 76 70 - 99 mg/dL  Glucose, capillary   Collection  Time: 12/18/18  4:28 PM  Result Value Ref Range   Glucose-Capillary 79 70 - 99 mg/dL  Glucose, capillary   Collection Time: 12/18/18  9:21 PM  Result Value Ref Range   Glucose-Capillary 104 (H) 70 - 99 mg/dL  Glucose, capillary   Collection Time: 12/19/18  2:06 AM  Result Value Ref Range   Glucose-Capillary 24 (LL) 70 - 99 mg/dL   Comment 1 Notify RN   Glucose, capillary   Collection Time: 12/19/18  2:27 AM  Result Value Ref Range   Glucose-Capillary 106 (H) 70 - 99 mg/dL  Glucose, capillary   Collection Time: 12/19/18  2:45 AM  Result Value Ref Range   Glucose-Capillary 66 (L) 70 - 99 mg/dL  Glucose, capillary   Collection Time: 12/19/18  3:17 AM  Result Value Ref Range   Glucose-Capillary 94 70 - 99 mg/dL  Type and screen [facility]   Collection Time: 12/19/18  4:54 AM  Result Value Ref Range   ABO/RH(D) O POS    Antibody Screen NEG    Sample Expiration      12/22/2018,2359 Performed at [facility], [address] [place], [place] [address]     Imaging Studies:      Medications:  Scheduled . docusate sodium  100 mg Oral Daily  . guaiFENesin  600 mg Oral BID  . insulin aspart  0-32 Units Subcutaneous TID PC  . insulin aspart  15 Units Subcutaneous TID WC  . insulin NPH Human  10 Units Subcutaneous Q supper  . insulin NPH Human  40 Units Subcutaneous QAC breakfast   And  . insulin NPH Human  15 Units Subcutaneous QHS  . insulin NPH Human  40 Units Subcutaneous QAC breakfast  . labetalol  400 mg Oral BID  . prenatal multivitamin  1 tablet Oral Q1200   I have reviewed the patient's current medications.  ASSESSMENT: [id number] Estimated Date of Delivery: 02/18/19  Hypoglycemic episode during night Patient Active Problem List   Diagnosis Date Noted  . Severe preeclampsia 12/16/2018  . Preeclampsia, severe, third trimester 12/15/2018  . COVID-19 virus detected 12/15/2018  . GDM (gestational diabetes mellitus) 11/25/2018  .  Glucosuria 11/18/2018  . Supervision of high risk pregnancy, antepartum 10/30/2018  . Late prenatal care, antepartum 10/30/2018  . Advanced maternal age in multigravida 10/30/2018  . History of preterm delivery, currently pregnant 10/30/2018    PLAN: >Continue labetalol 400 BID, good BP control on this dosing >S/P magnesium and betamethasone x 2 >No COVID 19 symptoms currently >continue in hospital observation and management with twice daily testing >anticipate delivery at 34 weeks or as clinically indicated  Central command is aware of patient's COVID status  Lisa Webb 12/19/2018,7:37 AM

## 2018-12-19 NOTE — Progress Notes (Signed)
CBG checked at 1357 and was 62. Pt. Capable of taking PO and 4 oz of juice given. CBG rechecked at 1412 and was 77. Pt. Eating lunch and will be due for a 2 hr pp at 1600

## 2018-12-19 NOTE — Progress Notes (Signed)
CRITICAL VALUE ALERT  Critical Value:  cbg 24  Date & Time Notied: 0211   Provider Notified: Eure  Orders Received/Actions taken: yes

## 2018-12-20 LAB — GLUCOSE, CAPILLARY
Glucose-Capillary: 39 mg/dL — CL (ref 70–99)
Glucose-Capillary: 83 mg/dL (ref 70–99)
Glucose-Capillary: 161 mg/dL — ABNORMAL HIGH (ref 70–99)
Glucose-Capillary: 59 mg/dL — ABNORMAL LOW (ref 70–99)
Glucose-Capillary: 78 mg/dL (ref 70–99)
Glucose-Capillary: 56 mg/dL — ABNORMAL LOW (ref 70–99)
Glucose-Capillary: 109 mg/dL — ABNORMAL HIGH (ref 70–99)
Glucose-Capillary: 106 mg/dL — ABNORMAL HIGH (ref 70–99)
Glucose-Capillary: 119 mg/dL — ABNORMAL HIGH (ref 70–99)

## 2018-12-20 NOTE — Progress Notes (Signed)
CRITICAL VALUE STICKER  CRITICAL VALUE: CBG 39  DATE & TIME OBTAINED: 0348   SYMPTOMS: Sweating, irritable  MD NOTIFIED: Dr. Macon Large  RESPONSE: Recheck in 15 minutes. Two Cranberry juices and 4 graham crackers provided to patient. BG 83 obtained at recheck.

## 2018-12-20 NOTE — Progress Notes (Signed)
Between 0335 and 0400, patient refusing fetal monitor. RN educated on importance of monitoring. Patient states "You all are never letting me sleep. I am so tired and stressed. You are messing up my insulin. No wonder my blood pressures are elevated."  Patient incidentally found to have low blood glucose. Treated; irritability resolved. Patient amenable to fetal monitoring as of 0400.

## 2018-12-20 NOTE — Progress Notes (Signed)
Contact with Dr. Macon Large in regards to patient dropping her blood sugars overnight. Provider instructed to hold insulin except for sliding scale. Will continue to monitor closely. Carmelina Dane, RN

## 2018-12-20 NOTE — Progress Notes (Signed)
Patient ID: Lisa SalaamWendy Mauss, female   DOB: 07/06/1976, 43 y.o.   MRN: [id number] FACULTY PRACTICE ANTEPARTUM(COMPREHENSIVE) NOTE  Lisa Webb is a 43 y.o. [id number] with Estimated Date of Delivery: 02/18/19   By   148w3d  who is admitted for pre eclampsia, severe feature: BP.    Fetal presentation is unsure. Length of Stay:  5  Days  Date of admission:12/15/2018  Subjective: No specific COVID related complaints Patient reports the fetal movement as active. Patient reports uterine contraction  activity as none. Patient reports  vaginal bleeding as none. Patient describes fluid per vagina as None.  Vitals:  Blood pressure 138/82, pulse 69, temperature 98.3 F (36.8 C), temperature source Oral, resp. rate 18, height 5\' 3"  (1.6 m), weight 111.3 kg, last menstrual period 05/12/2018, SpO2 96 %. Vitals:   12/20/18 0325 12/20/18 0800 12/20/18 1200 12/20/18 1530  BP: (!) 153/86 (!) 156/82 (!) 158/77 138/82  Pulse: 66 72 74 69  Resp: 16 18 16 18   Temp: 98 F (36.7 C) 98 F (36.7 C) 98.1 F (36.7 C) 98.3 F (36.8 C)  TempSrc: Oral Oral Oral Oral  SpO2: 96%     Weight:      Height:       Physical Examination:  General appearance - alert, well appearing, and in no distress Fundal Height:  size equals dates Pelvic Exam:  examination not Membranes:intact  Fetal Monitoring:  Reactive NST   reactive  Labs:  Results for orders placed or performed during the hospital encounter of 12/15/18 (from the past 24 hour(s))  Glucose, capillary   Collection Time: 12/19/18  4:44 PM  Result Value Ref Range   Glucose-Capillary 95 70 - 99 mg/dL  Glucose, capillary   Collection Time: 12/19/18  6:12 PM  Result Value Ref Range   Glucose-Capillary 49 (L) 70 - 99 mg/dL  Glucose, capillary   Collection Time: 12/19/18  6:31 PM  Result Value Ref Range   Glucose-Capillary 72 70 - 99 mg/dL  Glucose, capillary   Collection Time: 12/19/18  8:06 PM  Result Value Ref Range   Glucose-Capillary 68 (L) 70 - 99  mg/dL  Glucose, capillary   Collection Time: 12/19/18 10:06 PM  Result Value Ref Range   Glucose-Capillary 82 70 - 99 mg/dL  Glucose, capillary   Collection Time: 12/20/18  3:47 AM  Result Value Ref Range   Glucose-Capillary 39 (LL) 70 - 99 mg/dL   Comment 1 Notify RN   Glucose, capillary   Collection Time: 12/20/18  4:09 AM  Result Value Ref Range   Glucose-Capillary 83 70 - 99 mg/dL  Glucose, capillary   Collection Time: 12/20/18  8:34 AM  Result Value Ref Range   Glucose-Capillary 59 (L) 70 - 99 mg/dL  Glucose, capillary   Collection Time: 12/20/18  9:21 AM  Result Value Ref Range   Glucose-Capillary 161 (H) 70 - 99 mg/dL  Glucose, capillary   Collection Time: 12/20/18 11:38 AM  Result Value Ref Range   Glucose-Capillary 78 70 - 99 mg/dL  Glucose, capillary   Collection Time: 12/20/18  1:19 PM  Result Value Ref Range   Glucose-Capillary 56 (L) 70 - 99 mg/dL  Glucose, capillary   Collection Time: 12/20/18  2:15 PM  Result Value Ref Range   Glucose-Capillary 109 (H) 70 - 99 mg/dL  Glucose, capillary   Collection Time: 12/20/18  3:27 PM  Result Value Ref Range   Glucose-Capillary 106 (H) 70 - 99 mg/dL    Imaging Studies:  Medications:  Scheduled . docusate sodium  100 mg Oral Daily  . guaiFENesin  600 mg Oral BID  . labetalol  600 mg Oral BID  . prenatal multivitamin  1 tablet Oral Q1200   I have reviewed the patient's current medications.  ASSESSMENT: [id number] [redacted]w[redacted]d Estimated Date of Delivery: 02/18/19  Hypoglycemia continues Patient Active Problem List   Diagnosis Date Noted  . Severe preeclampsia 12/16/2018  . Preeclampsia, severe, third trimester 12/15/2018  . COVID-19 virus detected 12/15/2018  . GDM (gestational diabetes mellitus) 11/25/2018  . Glucosuria 11/18/2018  . Supervision of high risk pregnancy, antepartum 10/30/2018  . Late prenatal care, antepartum 10/30/2018  . Advanced maternal age in multigravida 10/30/2018  . History of preterm  delivery, currently pregnant 10/30/2018    PLAN: >labeatlol increased to 600 BID with better control >stop all insulin due to hypoglycemia   >S/P magnesium and betamethasone x 2 >No COVID 19 symptoms currently >continue in hospital observation and management with twice daily testing >anticipate delivery at 34 weeks or as clinically indicated  Central command is aware of patient's COVID status  Lazaro Arms 12/20/2018,4:26 PM

## 2018-12-20 NOTE — Progress Notes (Signed)
Patient CBG is 56 and patient is asymptomatic.Lunch tray is here and patient is instructed to eat.

## 2018-12-20 NOTE — Progress Notes (Signed)
Pts blood sugar is 59. Pt given 1 can of soda and graham crackers. Will recheck in 15 minutes. Pt is asymptomatic. Carmelina Dane, RN

## 2018-12-21 LAB — COMPREHENSIVE METABOLIC PANEL
ALT: 32 U/L (ref 0–44)
AST: 34 U/L (ref 15–41)
Albumin: 1.7 g/dL — ABNORMAL LOW (ref 3.5–5.0)
Alkaline Phosphatase: 197 U/L — ABNORMAL HIGH (ref 38–126)
Anion gap: 8 (ref 5–15)
BUN: 14 mg/dL (ref 6–20)
CO2: 21 mmol/L — ABNORMAL LOW (ref 22–32)
Calcium: 7.6 mg/dL — ABNORMAL LOW (ref 8.9–10.3)
Chloride: 106 mmol/L (ref 98–111)
Creatinine, Ser: 0.86 mg/dL (ref 0.44–1.00)
GFR calc Af Amer: >60 mL/min (ref >60)
GFR calc non Af Amer: >60 mL/min (ref >60)
Glucose, Bld: 73 mg/dL (ref 70–99)
Potassium: 5.5 mmol/L — ABNORMAL HIGH (ref 3.5–5.1)
Sodium: 135 mmol/L (ref 135–145)
Total Bilirubin: 0.4 mg/dL (ref 0.3–1.2)
Total Protein: 4.5 g/dL — ABNORMAL LOW (ref 6.5–8.1)

## 2018-12-21 LAB — CBC
HCT: 41.4 % (ref 36.0–46.0)
Hemoglobin: 13.4 g/dL (ref 12.0–15.0)
MCH: 26.4 pg (ref 26.0–34.0)
MCHC: 32.4 g/dL (ref 30.0–36.0)
MCV: 81.7 fL (ref 80.0–100.0)
Platelets: 213 10*3/uL (ref 150–400)
RBC: 5.07 MIL/uL (ref 3.87–5.11)
RDW: 13.6 % (ref 11.5–15.5)
WBC: 4.4 10*3/uL (ref 4.0–10.5)
nRBC: 0 % (ref 0.0–0.2)

## 2018-12-21 LAB — GLUCOSE, CAPILLARY
Glucose-Capillary: 67 mg/dL — ABNORMAL LOW (ref 70–99)
Glucose-Capillary: 135 mg/dL — ABNORMAL HIGH (ref 70–99)
Glucose-Capillary: 87 mg/dL (ref 70–99)
Glucose-Capillary: 98 mg/dL (ref 70–99)

## 2018-12-21 MED ORDER — LORATADINE 10 MG PO TABS
10.0000 mg | ORAL_TABLET | Freq: Every day | ORAL | Status: DC
  Administered 2018-12-21: 10 mg via ORAL
  Filled 2018-12-21: qty 1

## 2018-12-21 MED ORDER — FLUTICASONE PROPIONATE 50 MCG/ACT NA SUSP
1.0000 | Freq: Every day | NASAL | Status: DC
  Administered 2018-12-21: 1 via NASAL
  Filled 2018-12-21: qty 16

## 2018-12-21 MED ORDER — LABETALOL HCL 200 MG PO TABS
600.0000 mg | ORAL_TABLET | Freq: Three times a day (TID) | ORAL | Status: DC
  Administered 2018-12-21 (×3): 600 mg via ORAL
  Filled 2018-12-21 (×3): qty 3

## 2018-12-21 NOTE — Progress Notes (Signed)
Patient refusing IV fluids and further medications. Patient states "If you hadn't told me not to eat and drink I wouldn't be getting dehydrated". Educated on importance of NPO status due to prior changes in FHR tracing. Patient requesting further discussion of plan of care with provider. Dr. Macon Large at bedside to discuss. Following discussion, patient refusing further interventions and requesting to be left alone.   Tia Masker, RN

## 2018-12-21 NOTE — Progress Notes (Signed)
Faculty Practice OB/GYN Attending Note  Patient is doing well, had FHR tracing in 130s with minimal-moderate variability, no accelerations and a couple of variable decels <30 seconds in the last few hours.  No reason for urgent delivery at this point, but will continue to watch closely.  Patient is frustrated with being NPO and having continuous FHR monitoring.  She wants to be discharged to go get a second opinion; she feels we are desiring to deliver her baby soon. She also says she feels this is all her nurse's fault, "everything goes wrong when she is here".  She was told that keeping her NPO and on continuous FHR monitoring was standard of care given current situation.  She was mad and went to bathroom, dismissing me.  Will continue close observation.  Jaynie Collins, MD, FACOG Obstetrician & Gynecologist, [facility] [employer], [facility]

## 2018-12-21 NOTE — Progress Notes (Addendum)
Patient ID: Lisa Webb, female   DOB: 06/19/1976, 43 y.o.   MRN: [id number] FACULTY PRACTICE ANTEPARTUM(COMPREHENSIVE) NOTE  Lisa Webb is a 43 y.o. [id number] with Estimated Date of Delivery: 02/18/19   By   3856w4d  who is admitted for severe range BP with significant proteinuria, severe pre eclampsia.    Fetal presentation is unsure. Length of Stay:  6  Days  Date of admission:12/15/2018  Subjective: No cough or body aches Patient reports the fetal movement as active. Patient reports uterine contraction  activity as none. Patient reports  vaginal bleeding as none. Patient describes fluid per vagina as None.  Vitals:  Blood pressure (!) 143/86, pulse 67, temperature 97.9 F (36.6 C), temperature source Oral, resp. rate 16, height 5\' 3"  (1.6 m), weight 111.3 kg, last menstrual period 05/12/2018, SpO2 99 %. Vitals:   12/20/18 2335 12/20/18 2350 12/21/18 0335 12/21/18 0345  BP: (!) 171/107 (!) 144/94 (!) 167/100 (!) 143/86  Pulse: 68 70 68 67  Resp:  19 16   Temp:  98.2 F (36.8 C) 97.9 F (36.6 C)   TempSrc:  Oral Oral   SpO2:   99%   Weight:      Height:       Physical Examination:  General appearance - alert, well appearing, and in no distress Fundal Height:  size equals dates Pelvic Exam:  examination not indicated Cervical Exam: Not evaluated. Extremities:  with DTRs  Membranes:intact  Fetal Monitoring:  Baseline: 130s bpm, Variability: Good {> 6 bpm), Accelerations: Reactive and Decelerations: Absent   reactive  Labs:  Results for orders placed or performed during the hospital encounter of 12/15/18 (from the past 24 hour(s))  Glucose, capillary   Collection Time: 12/20/18  9:21 AM  Result Value Ref Range   Glucose-Capillary 161 (H) 70 - 99 mg/dL  Glucose, capillary   Collection Time: 12/20/18 11:38 AM  Result Value Ref Range   Glucose-Capillary 78 70 - 99 mg/dL  Glucose, capillary   Collection Time: 12/20/18  1:19 PM  Result Value Ref Range   Glucose-Capillary  56 (L) 70 - 99 mg/dL  Glucose, capillary   Collection Time: 12/20/18  2:15 PM  Result Value Ref Range   Glucose-Capillary 109 (H) 70 - 99 mg/dL  Glucose, capillary   Collection Time: 12/20/18  3:27 PM  Result Value Ref Range   Glucose-Capillary 106 (H) 70 - 99 mg/dL  Glucose, capillary   Collection Time: 12/20/18  9:33 PM  Result Value Ref Range   Glucose-Capillary 119 (H) 70 - 99 mg/dL    Imaging Studies:      Medications:  Scheduled . docusate sodium  100 mg Oral Daily  . fluticasone  1 spray Each Nare Daily  . guaiFENesin  600 mg Oral BID  . labetalol  600 mg Oral BID  . loratadine  10 mg Oral Daily  . prenatal multivitamin  1 tablet Oral Q1200   I have reviewed the patient's current medications.  ASSESSMENT: [id number] Estimated Date of Delivery: 02/18/19  Patient Active Problem List   Diagnosis Date Noted  . Severe preeclampsia 12/16/2018  . Preeclampsia, severe, third trimester 12/15/2018  . COVID-19 virus detected 12/15/2018  . GDM (gestational diabetes mellitus) 11/25/2018  . Glucosuria 11/18/2018  . Supervision of high risk pregnancy, antepartum 10/30/2018  . Late prenatal care, antepartum 10/30/2018  . Advanced maternal age in multigravida 10/30/2018  . History of preterm delivery, currently pregnant 10/30/2018    PLAN: >increase labetalol 600 TID for BP  management >S/P magnesium and betamethasone >Stopped all insulin due to low blood sugars, will add back if required    Central command is aware of patient's COVID positive status  Voncille Simm H Keigan Girten 12/21/2018,8:51 AM

## 2018-12-21 NOTE — Progress Notes (Signed)
Faculty Practice OB/GYN Attending Note  I was called and informed about patient's FHR deceleration to a nadir 60s for 3 mins around 1930.  On review of strip, patient had other concerning variable decels that were to 110s and not as concerning at his last one, but this is a new finding.  Baseline is currently 130 bpm, no accelerations. BP is 157/90, Labetalol had been increased to 600 mg po tid earlier today.  No reported symptoms. Stable labs earlier today.  Will continue to monitor her closely, may need to proceed with cesarean section if this persists.  Anesthesia and Neonatology teams aware. NPO for now, continuous FHR monitoring.  Jaynie Collins, MD, FACOG Obstetrician & Gynecologist, [facility] [employer], [facility]

## 2018-12-22 ENCOUNTER — Encounter (HOSPITAL_COMMUNITY)
Admission: AD | Disposition: A | Discharge status: Home / Self Care | Payer: Self-pay | Source: Ambulatory Visit | Attending: Family Medicine

## 2018-12-22 ENCOUNTER — Inpatient Hospital Stay (HOSPITAL_COMMUNITY): Payer: No Typology Code available for payment source | Admitting: Anesthesiology

## 2018-12-22 ENCOUNTER — Encounter (HOSPITAL_COMMUNITY): Payer: Self-pay | Admitting: Family Medicine

## 2018-12-22 ENCOUNTER — Encounter: Payer: Self-pay | Admitting: *Deleted

## 2018-12-22 DIAGNOSIS — O1414 Severe pre-eclampsia complicating childbirth: Secondary | ICD-10-CM

## 2018-12-22 DIAGNOSIS — Z3A30 30 weeks gestation of pregnancy: Secondary | ICD-10-CM

## 2018-12-22 DIAGNOSIS — Z98891 History of uterine scar from previous surgery: Secondary | ICD-10-CM

## 2018-12-22 LAB — CBC
HCT: 42.9 % (ref 36.0–46.0)
Hemoglobin: 14 g/dL (ref 12.0–15.0)
MCH: 26.3 pg (ref 26.0–34.0)
MCHC: 32.6 g/dL (ref 30.0–36.0)
MCV: 80.6 fL (ref 80.0–100.0)
Platelets: 206 10*3/uL (ref 150–400)
RBC: 5.32 MIL/uL — ABNORMAL HIGH (ref 3.87–5.11)
RDW: 13.6 % (ref 11.5–15.5)
WBC: 4.7 10*3/uL (ref 4.0–10.5)
nRBC: 0 % (ref 0.0–0.2)

## 2018-12-22 LAB — COMPREHENSIVE METABOLIC PANEL
ALT: 38 U/L (ref 0–44)
AST: 41 U/L (ref 15–41)
Albumin: 1.9 g/dL — ABNORMAL LOW (ref 3.5–5.0)
Alkaline Phosphatase: 256 U/L — ABNORMAL HIGH (ref 38–126)
Anion gap: 7 (ref 5–15)
BUN: 17 mg/dL (ref 6–20)
CO2: 21 mmol/L — ABNORMAL LOW (ref 22–32)
Calcium: 8 mg/dL — ABNORMAL LOW (ref 8.9–10.3)
Chloride: 110 mmol/L (ref 98–111)
Creatinine, Ser: 0.99 mg/dL (ref 0.44–1.00)
GFR calc Af Amer: >60 mL/min (ref >60)
GFR calc non Af Amer: >60 mL/min (ref >60)
Glucose, Bld: 84 mg/dL (ref 70–99)
Potassium: 4.8 mmol/L (ref 3.5–5.1)
Sodium: 138 mmol/L (ref 135–145)
Total Bilirubin: 0.4 mg/dL (ref 0.3–1.2)
Total Protein: 5.1 g/dL — ABNORMAL LOW (ref 6.5–8.1)

## 2018-12-22 LAB — PREPARE RBC (CROSSMATCH)

## 2018-12-22 SURGERY — Surgical Case
Anesthesia: Spinal | Wound class: Clean Contaminated

## 2018-12-22 MED ORDER — EPHEDRINE SULFATE 50 MG/ML IJ SOLN
INTRAMUSCULAR | Status: DC | PRN
  Administered 2018-12-22 (×3): 5 mg via INTRAVENOUS
  Administered 2018-12-22: 10 mg via INTRAVENOUS
  Administered 2018-12-22 (×2): 5 mg via INTRAVENOUS

## 2018-12-22 MED ORDER — MENTHOL 3 MG MT LOZG: 1.0000 | LOZENGE | OROMUCOSAL | Status: DC | PRN

## 2018-12-22 MED ORDER — LACTATED RINGERS IV SOLN
INTRAVENOUS | Status: DC | PRN
  Administered 2018-12-22: 05:00:00 via INTRAVENOUS

## 2018-12-22 MED ORDER — MAGNESIUM SULFATE 40 G IN LACTATED RINGERS - SIMPLE
INTRAVENOUS | Status: AC
  Filled 2018-12-22: qty 500

## 2018-12-22 MED ORDER — COCONUT OIL OIL: 1.0000 "application " | TOPICAL_OIL | Status: DC | PRN

## 2018-12-22 MED ORDER — FERROUS SULFATE 325 (65 FE) MG PO TABS
325.0000 mg | ORAL_TABLET | Freq: Two times a day (BID) | ORAL | Status: DC
  Administered 2018-12-22 – 2018-12-24 (×5): 325 mg via ORAL
  Filled 2018-12-22 (×5): qty 1

## 2018-12-22 MED ORDER — EPHEDRINE 5 MG/ML INJ
INTRAVENOUS | Status: AC
  Filled 2018-12-22: qty 10

## 2018-12-22 MED ORDER — DIPHENHYDRAMINE HCL 25 MG PO CAPS: 25.0000 mg | ORAL_CAPSULE | Freq: Four times a day (QID) | ORAL | Status: DC | PRN

## 2018-12-22 MED ORDER — LABETALOL HCL 200 MG PO TABS: 800.0000 mg | ORAL_TABLET | Freq: Three times a day (TID) | ORAL | Status: DC

## 2018-12-22 MED ORDER — HYDRALAZINE HCL 20 MG/ML IJ SOLN
10.0000 mg | INTRAMUSCULAR | Status: DC | PRN
  Administered 2018-12-22: 10 mg via INTRAVENOUS
  Filled 2018-12-22: qty 1

## 2018-12-22 MED ORDER — SODIUM CHLORIDE (PF) 0.9 % IJ SOLN
INTRAMUSCULAR | Status: AC
  Filled 2018-12-22: qty 10

## 2018-12-22 MED ORDER — SCOPOLAMINE 1 MG/3DAYS TD PT72
MEDICATED_PATCH | TRANSDERMAL | Status: DC | PRN
  Administered 2018-12-22: 1 via TRANSDERMAL

## 2018-12-22 MED ORDER — OXYCODONE-ACETAMINOPHEN 5-325 MG PO TABS
1.0000 | ORAL_TABLET | ORAL | Status: DC | PRN
  Administered 2018-12-23 (×2): 1 via ORAL
  Administered 2018-12-23: 08:00:00 2 via ORAL
  Filled 2018-12-22: qty 2
  Filled 2018-12-22 (×2): qty 1

## 2018-12-22 MED ORDER — MAGNESIUM SULFATE BOLUS VIA INFUSION
4.0000 g | Freq: Once | INTRAVENOUS | Status: DC
  Administered 2018-12-22: 4 g via INTRAVENOUS
  Filled 2018-12-22: qty 500

## 2018-12-22 MED ORDER — OXYCODONE HCL 5 MG/5ML PO SOLN: 5.0000 mg | Freq: Once | ORAL | Status: DC | PRN

## 2018-12-22 MED ORDER — CEFAZOLIN SODIUM-DEXTROSE 2-4 GM/100ML-% IV SOLN
2.0000 g | INTRAVENOUS | Status: AC
  Administered 2018-12-22: 05:00:00 2 g via INTRAVENOUS
  Filled 2018-12-22: qty 100

## 2018-12-22 MED ORDER — MEASLES, MUMPS & RUBELLA VAC IJ SOLR: 0.5000 mL | Freq: Once | INTRAMUSCULAR | Status: DC

## 2018-12-22 MED ORDER — BUPIVACAINE HCL (PF) 0.5 % IJ SOLN
INTRAMUSCULAR | Status: AC
  Filled 2018-12-22: qty 30

## 2018-12-22 MED ORDER — DEXAMETHASONE SODIUM PHOSPHATE 4 MG/ML IJ SOLN
INTRAMUSCULAR | Status: AC
  Filled 2018-12-22: qty 1

## 2018-12-22 MED ORDER — MAGNESIUM SULFATE BOLUS VIA INFUSION
4.0000 g | INTRAVENOUS | Status: AC
  Administered 2018-12-22: 4 g via INTRAVENOUS
  Filled 2018-12-22: qty 500

## 2018-12-22 MED ORDER — AMLODIPINE BESYLATE 5 MG PO TABS
5.0000 mg | ORAL_TABLET | Freq: Every day | ORAL | Status: DC
  Administered 2018-12-22 – 2018-12-24 (×3): 5 mg via ORAL
  Filled 2018-12-22 (×3): qty 1

## 2018-12-22 MED ORDER — OXYCODONE-ACETAMINOPHEN 5-325 MG PO TABS: 2.0000 | ORAL_TABLET | ORAL | Status: DC | PRN

## 2018-12-22 MED ORDER — DIBUCAINE (PERIANAL) 1 % EX OINT: 1.0000 "application " | TOPICAL_OINTMENT | CUTANEOUS | Status: DC | PRN

## 2018-12-22 MED ORDER — MAGNESIUM SULFATE 40 G IN LACTATED RINGERS - SIMPLE: 2.0000 g/h | INTRAVENOUS | Status: DC

## 2018-12-22 MED ORDER — ACETAMINOPHEN 325 MG PO TABS: 325.0000 mg | ORAL_TABLET | ORAL | Status: DC | PRN

## 2018-12-22 MED ORDER — FENTANYL CITRATE (PF) 100 MCG/2ML IJ SOLN
INTRAMUSCULAR | Status: DC | PRN
  Administered 2018-12-22: 15 ug via INTRATHECAL

## 2018-12-22 MED ORDER — MAGNESIUM SULFATE 40 G IN LACTATED RINGERS - SIMPLE: INTRAVENOUS | Status: DC | PRN

## 2018-12-22 MED ORDER — MORPHINE SULFATE (PF) 0.5 MG/ML IJ SOLN
INTRAMUSCULAR | Status: DC | PRN
  Administered 2018-12-22: .15 mg via INTRATHECAL

## 2018-12-22 MED ORDER — OXYCODONE HCL 5 MG PO TABS: 5.0000 mg | ORAL_TABLET | Freq: Once | ORAL | Status: DC | PRN

## 2018-12-22 MED ORDER — DIPHENHYDRAMINE HCL 50 MG/ML IJ SOLN
INTRAMUSCULAR | Status: DC | PRN
  Administered 2018-12-22: 25 mg via INTRAVENOUS

## 2018-12-22 MED ORDER — HYDROMORPHONE HCL 1 MG/ML IJ SOLN: 1.0000 mg | INTRAMUSCULAR | Status: DC | PRN

## 2018-12-22 MED ORDER — OXYTOCIN 40 UNITS IN NORMAL SALINE INFUSION - SIMPLE MED
INTRAVENOUS | Status: AC
  Filled 2018-12-22: qty 1000

## 2018-12-22 MED ORDER — SODIUM CHLORIDE 0.9 % IV SOLN
INTRAVENOUS | Status: DC | PRN
  Administered 2018-12-22: 40 [IU] via INTRAVENOUS

## 2018-12-22 MED ORDER — WITCH HAZEL-GLYCERIN EX PADS: 1.0000 "application " | MEDICATED_PAD | CUTANEOUS | Status: DC | PRN

## 2018-12-22 MED ORDER — ACETAMINOPHEN 160 MG/5ML PO SOLN: 325.0000 mg | ORAL | Status: DC | PRN

## 2018-12-22 MED ORDER — LACTATED RINGERS IV SOLN
INTRAVENOUS | Status: DC
  Administered 2018-12-22: 15:00:00 via INTRAVENOUS

## 2018-12-22 MED ORDER — PRENATAL MULTIVITAMIN CH
1.0000 | ORAL_TABLET | Freq: Every day | ORAL | Status: DC
  Administered 2018-12-22 – 2018-12-23 (×2): 1 via ORAL
  Filled 2018-12-22 (×2): qty 1

## 2018-12-22 MED ORDER — DIPHENHYDRAMINE HCL 50 MG/ML IJ SOLN
INTRAMUSCULAR | Status: AC
  Filled 2018-12-22: qty 1

## 2018-12-22 MED ORDER — PHENYLEPHRINE HCL-NACL 20-0.9 MG/250ML-% IV SOLN
INTRAVENOUS | Status: DC | PRN
  Administered 2018-12-22: 60 ug/min via INTRAVENOUS

## 2018-12-22 MED ORDER — NALOXONE HCL 0.4 MG/ML IJ SOLN: 0.4000 mg | INTRAMUSCULAR | Status: DC | PRN

## 2018-12-22 MED ORDER — LIDOCAINE-EPINEPHRINE (PF) 2 %-1:200000 IJ SOLN
INTRAMUSCULAR | Status: AC
  Filled 2018-12-22: qty 20

## 2018-12-22 MED ORDER — SIMETHICONE 80 MG PO CHEW
80.0000 mg | CHEWABLE_TABLET | ORAL | Status: DC | PRN
  Administered 2018-12-23 (×2): 80 mg via ORAL
  Filled 2018-12-22 (×2): qty 1

## 2018-12-22 MED ORDER — METOCLOPRAMIDE HCL 5 MG/ML IJ SOLN
INTRAMUSCULAR | Status: AC
  Filled 2018-12-22: qty 2

## 2018-12-22 MED ORDER — MEPERIDINE HCL 25 MG/ML IJ SOLN: 6.2500 mg | INTRAMUSCULAR | Status: DC | PRN

## 2018-12-22 MED ORDER — ENOXAPARIN SODIUM 60 MG/0.6ML ~~LOC~~ SOLN
55.0000 mg | SUBCUTANEOUS | Status: DC
  Administered 2018-12-23 – 2018-12-24 (×2): 55 mg via SUBCUTANEOUS
  Filled 2018-12-22 (×2): qty 0.6

## 2018-12-22 MED ORDER — OXYTOCIN 40 UNITS IN NORMAL SALINE INFUSION - SIMPLE MED
2.5000 [IU]/h | INTRAVENOUS | Status: DC
  Administered 2018-12-22: 2.5 [IU]/h via INTRAVENOUS
  Filled 2018-12-22: qty 1000

## 2018-12-22 MED ORDER — MAGNESIUM SULFATE 40 G IN LACTATED RINGERS - SIMPLE
2.0000 g/h | INTRAVENOUS | Status: DC
  Administered 2018-12-22: 2 g/h via INTRAVENOUS
  Filled 2018-12-22: qty 500

## 2018-12-22 MED ORDER — GABAPENTIN 300 MG PO CAPS
300.0000 mg | ORAL_CAPSULE | Freq: Two times a day (BID) | ORAL | Status: DC
  Administered 2018-12-22 – 2018-12-24 (×5): 300 mg via ORAL
  Filled 2018-12-22 (×5): qty 1

## 2018-12-22 MED ORDER — DEXAMETHASONE SODIUM PHOSPHATE 4 MG/ML IJ SOLN
INTRAMUSCULAR | Status: DC | PRN
  Administered 2018-12-22: 4 mg via INTRAVENOUS

## 2018-12-22 MED ORDER — SODIUM CHLORIDE 0.9 % IV SOLN
INTRAVENOUS | Status: DC | PRN
  Administered 2018-12-22: 05:00:00 via INTRAVENOUS

## 2018-12-22 MED ORDER — KETOROLAC TROMETHAMINE 30 MG/ML IJ SOLN
30.0000 mg | Freq: Four times a day (QID) | INTRAMUSCULAR | Status: AC
  Administered 2018-12-22 – 2018-12-23 (×4): 30 mg via INTRAVENOUS
  Filled 2018-12-22 (×4): qty 1

## 2018-12-22 MED ORDER — ONDANSETRON HCL 4 MG/2ML IJ SOLN
INTRAMUSCULAR | Status: DC | PRN
  Administered 2018-12-22: 4 mg via INTRAVENOUS

## 2018-12-22 MED ORDER — ZOLPIDEM TARTRATE 5 MG PO TABS: 5.0000 mg | ORAL_TABLET | Freq: Every evening | ORAL | Status: DC | PRN

## 2018-12-22 MED ORDER — SODIUM CHLORIDE 0.9 % IV SOLN
INTRAVENOUS | Status: DC | PRN
  Administered 2018-12-22: 2 g via INTRAVENOUS

## 2018-12-22 MED ORDER — MEDROXYPROGESTERONE ACETATE 150 MG/ML IM SUSP: 150.0000 mg | INTRAMUSCULAR | Status: DC | PRN

## 2018-12-22 MED ORDER — METOCLOPRAMIDE HCL 5 MG/ML IJ SOLN
INTRAMUSCULAR | Status: DC | PRN
  Administered 2018-12-22: 10 mg via INTRAVENOUS

## 2018-12-22 MED ORDER — SENNOSIDES-DOCUSATE SODIUM 8.6-50 MG PO TABS
2.0000 | ORAL_TABLET | ORAL | Status: DC
  Administered 2018-12-23 (×2): 2 via ORAL
  Filled 2018-12-22 (×2): qty 2

## 2018-12-22 MED ORDER — LABETALOL HCL 5 MG/ML IV SOLN: 40.0000 mg | INTRAVENOUS | Status: DC | PRN

## 2018-12-22 MED ORDER — SIMETHICONE 80 MG PO CHEW
80.0000 mg | CHEWABLE_TABLET | ORAL | Status: DC
  Administered 2018-12-23: 80 mg via ORAL
  Filled 2018-12-22 (×2): qty 1

## 2018-12-22 MED ORDER — LABETALOL HCL 5 MG/ML IV SOLN: 20.0000 mg | INTRAVENOUS | Status: DC | PRN

## 2018-12-22 MED ORDER — ONDANSETRON HCL 4 MG/2ML IJ SOLN: 4.0000 mg | Freq: Three times a day (TID) | INTRAMUSCULAR | Status: DC | PRN

## 2018-12-22 MED ORDER — TETANUS-DIPHTH-ACELL PERTUSSIS 5-2.5-18.5 LF-MCG/0.5 IM SUSP
0.5000 mL | Freq: Once | INTRAMUSCULAR | Status: DC
  Filled 2018-12-22: qty 0.5

## 2018-12-22 MED ORDER — TRAMADOL HCL 50 MG PO TABS: 50.0000 mg | ORAL_TABLET | Freq: Four times a day (QID) | ORAL | Status: DC | PRN

## 2018-12-22 MED ORDER — BUPIVACAINE IN DEXTROSE 0.75-8.25 % IT SOLN
INTRATHECAL | Status: DC | PRN
  Administered 2018-12-22: 1.6 mL via INTRATHECAL

## 2018-12-22 MED ORDER — SODIUM CHLORIDE 0.9% FLUSH: 3.0000 mL | INTRAVENOUS | Status: DC | PRN

## 2018-12-22 MED ORDER — MORPHINE SULFATE (PF) 0.5 MG/ML IJ SOLN
INTRAMUSCULAR | Status: AC
  Filled 2018-12-22: qty 10

## 2018-12-22 MED ORDER — FENTANYL CITRATE (PF) 100 MCG/2ML IJ SOLN
INTRAMUSCULAR | Status: AC
  Filled 2018-12-22: qty 2

## 2018-12-22 MED ORDER — ONDANSETRON HCL 4 MG/2ML IJ SOLN: 4.0000 mg | Freq: Once | INTRAMUSCULAR | Status: DC | PRN

## 2018-12-22 MED ORDER — IBUPROFEN 800 MG PO TABS
800.0000 mg | ORAL_TABLET | Freq: Four times a day (QID) | ORAL | Status: DC
  Administered 2018-12-23 – 2018-12-24 (×5): 800 mg via ORAL
  Filled 2018-12-22 (×5): qty 1

## 2018-12-22 MED ORDER — PHENYLEPHRINE HCL (PRESSORS) 10 MG/ML IV SOLN
INTRAVENOUS | Status: DC | PRN
  Administered 2018-12-22: 80 ug via INTRAVENOUS
  Administered 2018-12-22 (×2): 40 ug via INTRAVENOUS

## 2018-12-22 MED ORDER — MAGNESIUM HYDROXIDE 400 MG/5ML PO SUSP: 30.0000 mL | ORAL | Status: DC | PRN

## 2018-12-22 MED ORDER — HYDRALAZINE HCL 20 MG/ML IJ SOLN
5.0000 mg | INTRAMUSCULAR | Status: DC | PRN
  Administered 2018-12-22 (×2): 5 mg via INTRAVENOUS
  Filled 2018-12-22 (×2): qty 1

## 2018-12-22 SURGICAL SUPPLY — 32 items
CHLORAPREP W/TINT 26ML (MISCELLANEOUS) ×3 IMPLANT
CLAMP CORD UMBIL (MISCELLANEOUS) IMPLANT
CLOTH BEACON ORANGE TIMEOUT ST (SAFETY) ×3 IMPLANT
DRSG OPSITE POSTOP 4X10 (GAUZE/BANDAGES/DRESSINGS) ×3 IMPLANT
ELECT REM PT RETURN 9FT ADLT (ELECTROSURGICAL) ×3
ELECTRODE REM PT RTRN 9FT ADLT (ELECTROSURGICAL) ×1 IMPLANT
EXTRACTOR VACUUM M CUP 4 TUBE (SUCTIONS) IMPLANT
EXTRACTOR VACUUM M CUP 4' TUBE (SUCTIONS)
GLOVE BIOGEL PI IND STRL 7.0 (GLOVE) ×3 IMPLANT
GLOVE BIOGEL PI INDICATOR 7.0 (GLOVE) ×6
GLOVE ECLIPSE 7.0 STRL STRAW (GLOVE) ×3 IMPLANT
GOWN STRL REUS W/TWL LRG LVL3 (GOWN DISPOSABLE) ×6 IMPLANT
KIT ABG SYR 3ML LUER SLIP (SYRINGE) IMPLANT
NDL HYPO 25X5/8 SAFETYGLIDE (NEEDLE) ×1 IMPLANT
NEEDLE HYPO 22GX1.5 SAFETY (NEEDLE) ×3 IMPLANT
NEEDLE HYPO 25X5/8 SAFETYGLIDE (NEEDLE) ×3 IMPLANT
NS IRRIG 1000ML POUR BTL (IV SOLUTION) ×3 IMPLANT
PACK C SECTION WH (CUSTOM PROCEDURE TRAY) ×3 IMPLANT
PAD ABD 7.5X8 STRL (GAUZE/BANDAGES/DRESSINGS) ×3 IMPLANT
PAD ABD DERMACEA PRESS 5X9 (GAUZE/BANDAGES/DRESSINGS) ×2 IMPLANT
PAD OB MATERNITY 4.3X12.25 (PERSONAL CARE ITEMS) ×3 IMPLANT
PENCIL SMOKE EVAC W/HOLSTER (ELECTROSURGICAL) ×3 IMPLANT
RTRCTR C-SECT PINK 25CM LRG (MISCELLANEOUS) IMPLANT
SUT PDS AB 0 CTX 36 PDP370T (SUTURE) IMPLANT
SUT PLAIN 2 0 XLH (SUTURE) IMPLANT
SUT VIC AB 0 CTX 36 (SUTURE) ×4
SUT VIC AB 0 CTX36XBRD ANBCTRL (SUTURE) ×2 IMPLANT
SUT VIC AB 4-0 KS 27 (SUTURE) ×3 IMPLANT
SYR CONTROL 10ML LL (SYRINGE) ×3 IMPLANT
TOWEL OR 17X24 6PK STRL BLUE (TOWEL DISPOSABLE) ×3 IMPLANT
TRAY FOLEY W/BAG SLVR 14FR LF (SET/KITS/TRAYS/PACK) ×3 IMPLANT
WATER STERILE IRR 1000ML POUR (IV SOLUTION) ×3 IMPLANT

## 2018-12-22 NOTE — Progress Notes (Signed)
Faculty Practice OB/GYN Attending Note  Subjective:  Called to evaluate patient with recurrent variable and late FHR decelerations with some of them prolonged.  Patient also has severe range BP requiring doses of Hydralazine IV.  No LOF or vaginal bleeding. Good FM.   Admitted on 12/15/2018 for Preeclampsia, severe, third trimester.    Objective:  Blood pressure (!) 159/89, pulse 66, temperature 97.9 F (36.6 C), temperature source Oral, resp. rate 16, height 5\' 3"  (1.6 m), weight 111.3 kg, last menstrual period 05/12/2018, SpO2 99 %.  Patient Vitals for the past 24 hrs:  BP Temp Temp src Pulse Resp SpO2  12/22/18 0125 (!) 159/89 - - 66 - -  12/22/18 0057 (!) 166/92 - - 64 - -  12/22/18 0044 (!) 174/97 97.9 F (36.6 C) Oral 63 16 -  12/21/18 1930 (!) 157/90 97.9 F (36.6 C) Oral 66 19 99 %  12/21/18 1555 (!) 156/99 97.9 F (36.6 C) - 72 16 99 %  12/21/18 1135 - 97.8 F (36.6 C) Oral - 18 -  12/21/18 0850 136/81 98.3 F (36.8 C) Oral 68 17 99 %   FHT  Baseline 135 bpm, minimal-moderate variability,  no accelerations, recurrentdeep and occasionally prolonged (2-3 mins) decelerations occurring after contractions. Toco: q3-6 mins Gen: NAD but upset HENT: Normocephalic, atraumatic Lungs: Normal respiratory effort Heart: Regular rate noted Abdomen: NT gravid fundus, soft Cervix: Deferred Ext: 2+ DTRs, no edema, no cyanosis, negative Homan's sign  Assessment & Plan:  43 y.o. [id number] at [redacted]w[redacted]d admitted for severe preeclampsia, now with persistent nonreassuring fetal heart rate pattern.   Also she is requiring IV doses of hydralazine, despite being on high doses of maintenance Labetalol. She is also an asymptomatic COVID positive patient.  Patient was counseled about this persistent nonreassuring FHR tracing and difficult to control BP; cesarean delivery recommended.  Patient agreed with this recommendation. The risks of cesarean section discussed with the patient included but were not  limited to: bleeding which may require transfusion or reoperation; infection which may require antibiotics; injury to bowel, bladder, ureters or other surrounding organs; injury to the fetus; need for additional procedures including hysterectomy in the event of a life-threatening hemorrhage; placental abnormalities wth subsequent pregnancies, incisional problems, thromboembolic phenomenon and other postoperative/anesthesia complications. The patient concurred with the proposed plan, giving informed written consent for the procedure.   Patient has been NPO since  yesterday she will remain NPO for procedure.   Neonatology, Anesthesia and OR aware. Preoperative prophylactic antibiotics and SCDs ordered on call to the OR.  STAT CBC, CMET ordered, will also restart magnesium sulfate for eclampsia prophylaxis.  She received betamethasone on 5/19 and 5/20.  To OR when ready.  Of note, everyone is aware of her COVID positive status and appropriate PPE has been designated for this procedure.   Jaynie Collins, MD, FACOG Obstetrician & Gynecologist, [facility] [employer], [facility]

## 2018-12-22 NOTE — Transfer of Care (Signed)
Immediate Anesthesia Transfer of Care Note  Patient: Lisa Webb  Procedure(s) Performed: CESAREAN SECTION (N/A )  Patient Location: PACU  Anesthesia Type:Spinal  Level of Consciousness: awake, alert  and oriented  Airway & Oxygen Therapy: Patient Spontanous Breathing and Patient connected to face mask oxygen  Post-op Assessment: Post -op Vital signs reviewed and stable  Post vital signs: Reviewed and stable  Last Vitals:  Vitals Value Taken Time  BP 123/81 12/22/2018  5:45 AM  Temp    Pulse 66 12/22/2018  5:45 AM  Resp    SpO2 4 % 12/22/2018  5:45 AM    Last Pain:  Vitals:   12/22/18 0545  TempSrc:   PainSc: 0-No pain      Patients Stated Pain Goal: 3 (12/21/18 1000)  Complications: No apparent anesthesia complications

## 2018-12-22 NOTE — Op Note (Addendum)
Lisa Webb PROCEDURE DATE: 12/22/2018  PREOPERATIVE DIAGNOSES: Intrauterine pregnancy at 6879w5d weeks gestation; non-reassuring fetal status and severe preeclampsia; asymptomatic COVID-19 patient  POSTOPERATIVE DIAGNOSES: The same  PROCEDURE: Primary Low Transverse Cesarean Section  SURGEON:  Dr. Jaynie CollinsUgonna Jashad Depaula  ANESTHESIOLOGY TEAM: Anesthesiologist: Trevor IhaHouser, Stephen A, MD CRNA: Jennelle Humanlayton, Lisa B, CRNA; Sterling, Jannet Askewharlesetta M, CRNA  INDICATIONS: Lisa Webb is a 43 y.o. [id number] at 579w5d here for cesarean section secondary to the indications listed under preoperative diagnoses; please see preoperative note for further details.  The risks of cesarean section were discussed with the patient including but were not limited to: bleeding which may require transfusion or reoperation; infection which may require antibiotics; injury to bowel, bladder, ureters or other surrounding organs; injury to the fetus; need for additional procedures including hysterectomy in the event of a life-threatening hemorrhage; placental abnormalities wth subsequent pregnancies, incisional problems, thromboembolic phenomenon and other postoperative/anesthesia complications.   The patient concurred with the proposed plan, giving informed written consent for the procedure.    FINDINGS:  Viable female infant in cephalic presentation.  Apgars 7 and 10. Arterial cord pH 7.15. Moderate meconium in amniotic fluid.  Intact placenta, three vessel cord.  Normal uterus, fallopian tubes and ovaries bilaterally.  ANESTHESIA: Spinal ESTIMATED BLOOD LOSS: 377 ml as per Triton SPECIMENS: Placenta sent to pathology COMPLICATIONS: None immediate  PROCEDURE IN DETAIL:  The patient preoperatively received intravenous antibiotics and had sequential compression devices applied to her lower extremities.  She was then taken to the operating room where spinal anesthesia was administered and was found to be adequate. She was then placed in a  dorsal supine position with a leftward tilt, and prepped and draped in a sterile manner.  A foley catheter was placed into her bladder and attached to constant gravity.  After an adequate timeout was performed, a Pfannenstiel skin incision was made with scalpel and carried through to the underlying layer of fascia. The fascia was incised in the midline, and this incision was extended bilaterally using the Mayo scissors.  Kocher clamps were applied to the superior aspect of the fascial incision and the underlying rectus muscles were dissected off bluntly and sharply.  A similar process was carried out on the inferior aspect of the fascial incision. The rectus muscles were separated in the midline and the peritoneum was entered bluntly. The Alexis self-retaining retractor was introduced into the abdominal cavity.  Attention was turned to the lower uterine segment where a low transverse hysterotomy was made with a scalpel and extended bilaterally bluntly.  The infant was successfully delivered, the cord was clamped and cut, and the infant was immediately handed over to the awaiting neonatology team. Uterine massage was then administered, and the placenta delivered intact with a three-vessel cord. The uterus was then cleared of clots and debris.  The hysterotomy was closed with 0 Vicryl in a running locked fashion, and an imbricating layer was also placed with 0 Vicryl.  Figure-of-eight 0 Vicryl serosal stitches were placed to help with hemostasis.  The pelvis was cleared of all clot and debris. Hemostasis was confirmed on all surfaces.  The retractor was removed.  The peritoneum was closed with a 0 Vicryl stitch. The fascia was then closed using 0 PDS in a running fashion.  The subcutaneous layer was irrigated, reapproximated with 2-0 plain gut interrupted stitches, and the skin was closed with a 4-0 Vicryl subcuticular stitch. The patient tolerated the procedure well. Sponge, instrument and needle counts were correct  x 3.  She was taken to the recovery room in stable condition.    Jaynie Collins, MD, FACOG Obstetrician & Gynecologist, [facility] [employer], [facility]

## 2018-12-22 NOTE — Anesthesia Postprocedure Evaluation (Signed)
Anesthesia Post Note  Patient: Lisa Webb  Procedure(s) Performed: CESAREAN SECTION (N/A )     Patient location during evaluation: Mother Baby Anesthesia Type: Spinal Level of consciousness: oriented and awake and alert Pain management: pain level controlled Vital Signs Assessment: post-procedure vital signs reviewed and stable Respiratory status: spontaneous breathing and respiratory function stable Cardiovascular status: blood pressure returned to baseline and stable Postop Assessment: no headache, no backache, no apparent nausea or vomiting and able to ambulate Anesthetic complications: no    Last Vitals:  Vitals:   12/22/18 0615 12/22/18 0630  BP: 121/86 (!) 143/78  Pulse: 69 62  Resp:    Temp:  36.5 C  SpO2: 100% 99%    Last Pain:  Vitals:   12/22/18 0630  TempSrc:   PainSc: 0-No pain   Pain Goal: Patients Stated Pain Goal: 3 (12/21/18 1000)  LLE Motor Response: Purposeful movement (12/22/18 0630) LLE Sensation: Numbness, Tingling (12/22/18 0630) RLE Motor Response: Purposeful movement (12/22/18 0630) RLE Sensation: Numbness, Tingling (12/22/18 0630)     Epidural/Spinal Function Cutaneous sensation: Able to Discern Pressure (12/22/18 0630), Patient able to flex knees: Yes (12/22/18 0630), Patient able to lift hips off bed: No (12/22/18 0630), Back pain beyond tenderness at insertion site: Yes - Notify Anesthesia (12/22/18 0630), Progressively worsening motor and/or sensory loss: No (12/22/18 0630), Bowel and/or bladder incontinence post epidural: No (12/22/18 0630)  Trevor Iha

## 2018-12-22 NOTE — Anesthesia Procedure Notes (Signed)
Spinal  Patient location during procedure: OB Start time: 12/22/2018 4:34 AM End time: 12/22/2018 4:39 AM Staffing Anesthesiologist: Trevor Iha, MD Performed: anesthesiologist  Preanesthetic Checklist Completed: patient identified, surgical consent, pre-op evaluation, timeout performed, IV checked, risks and benefits discussed and monitors and equipment checked Spinal Block Patient position: sitting Prep: site prepped and draped and DuraPrep Patient monitoring: heart rate, cardiac monitor, continuous pulse ox and blood pressure Approach: midline Location: L3-4 Injection technique: single-shot Needle Needle type: Pencan  Needle gauge: 24 G Needle length: 10 cm Needle insertion depth: 9 cm Assessment Sensory level: T4 Additional Notes 1 Attempt (s). Pt tolerated procedure well.

## 2018-12-22 NOTE — Anesthesia Preprocedure Evaluation (Addendum)
Anesthesia Evaluation  Patient identified by MRN, date of birth, ID band Patient awake    Reviewed: Allergy & Precautions, NPO status , Patient's Chart, lab work & pertinent test results  Airway Mallampati: III  TM Distance: >3 FB Neck ROM: Full    Dental no notable dental hx. (+) Teeth Intact   Pulmonary  + Covid 19   Pulmonary exam normal breath sounds clear to auscultation       Cardiovascular Exercise Tolerance: Good hypertension, Pt. on medications Normal cardiovascular exam Rhythm:Regular Rate:Normal  Severe PIH   Neuro/Psych  Headaches,    GI/Hepatic negative GI ROS, Neg liver ROS,   Endo/Other  diabetes  Renal/GU      Musculoskeletal   Abdominal   Peds  Hematology   Anesthesia Other Findings   Reproductive/Obstetrics (+) Pregnancy                             Anesthesia Physical Anesthesia Plan  ASA: III  Anesthesia Plan: Spinal   Post-op Pain Management:    Induction:   PONV Risk Score and Plan: Treatment may vary due to age or medical condition  Airway Management Planned: Natural Airway  Additional Equipment:   Intra-op Plan:   Post-operative Plan: Extubation in OR  Informed Consent: I have reviewed the patients History and Physical, chart, labs and discussed the procedure including the risks, benefits and alternatives for the proposed anesthesia with the patient or authorized representative who has indicated his/her understanding and acceptance.       Plan Discussed with: CRNA, Anesthesiologist and Surgeon  Anesthesia Plan Comments: (31.4/7 Wk G5P2 Severe Pre  + Covid for C section under Spinal)       Anesthesia Quick Evaluation

## 2018-12-22 NOTE — Progress Notes (Signed)
Dr. Macon Large notified of severe range blood pressures and patient's report that "the doctor said I wasn't getting any more IV blood pressure medications". See orders. 5 mg IV hydralazine given.

## 2018-12-22 NOTE — Lactation Note (Signed)
This note was copied from a baby's chart. Lactation Consultation Note  Patient Name: Lisa Webb LNLGX'Q Date: 12/22/2018 Reason for consult: Initial assessment;NICU baby;Preterm <34wks;1st time breastfeeding GDM, GHTN on MgSO4 Asymptomatic Covid+  P4 Mom of 31w 5d baby delivered 7 hrs ago and is in the NICU.  Mom asked her RN to set up DEBP and assist her with starting to pump at <6 hrs post delivery. (Mom states she has not breastfed any of her other 3 children) RN did teaching on how to double pump on initiation setting and disassemble pump parts, wash, rinse and air dry them.  RN to review breast massage and hand expression, colostrum containers provided.  Numbered sticker dots, and handouts on how to safely collect Mom's EBM to transport to NICU.  RN and LC talked about importance of 2 person transfer of of EBM into plastic ziplock bag before taking to NICU.  Mom to mask and use good hand hygiene before and after pumping.  Disinfectant spray given to RN, and educated on using after pump parts have been washed and rinsed, allowing to air dry.  NICU Lactation booklet and Lactation brochure given to RN to take into Mom's room.   Will follow-up prn.      Maternal Data Formula Feeding for Exclusion: Yes Reason for exclusion: Admission to Intensive Care Unit (ICU) post-partum Has patient been taught Hand Expression?: Yes(by RN) Does the patient have breastfeeding experience prior to this delivery?: No  Feeding Feeding Type: Breast Milk   Interventions Interventions: DEBP  Lactation Tools Discussed/Used Tools: Pump Breast pump type: Double-Electric Breast Pump Pump Review: Setup, frequency, and cleaning;Milk Storage Initiated by:: Maryruth Hancock [facility] RN Date initiated:: 12/22/18   Consult Status Consult Status: Follow-up Date: 12/23/18 Follow-up type: In-patient    [name] 12/22/2018, 1:50 PM

## 2018-12-23 ENCOUNTER — Encounter: Payer: Self-pay | Admitting: *Deleted

## 2018-12-23 DIAGNOSIS — R7401 Elevation of levels of liver transaminase levels: Secondary | ICD-10-CM | POA: Diagnosis not present

## 2018-12-23 LAB — BPAM RBC
Blood Product Expiration Date: 202006162359
Blood Product Expiration Date: 202006232359
Blood Product Expiration Date: 202006232359
ISSUE DATE / TIME: 202005251857
ISSUE DATE / TIME: 202005251857
Unit Type and Rh: 5100
Unit Type and Rh: 5100
Unit Type and Rh: 5100

## 2018-12-23 LAB — TYPE AND SCREEN
ABO/RH(D): O POS
Antibody Screen: NEGATIVE
Unit division: 0
Unit division: 0
Unit division: 0

## 2018-12-23 LAB — CBC
HCT: 39 % (ref 36.0–46.0)
HCT: 39.7 % (ref 36.0–46.0)
Hemoglobin: 12.7 g/dL (ref 12.0–15.0)
Hemoglobin: 13.2 g/dL (ref 12.0–15.0)
MCH: 26.7 pg (ref 26.0–34.0)
MCH: 27.2 pg (ref 26.0–34.0)
MCHC: 32.6 g/dL (ref 30.0–36.0)
MCHC: 33.2 g/dL (ref 30.0–36.0)
MCV: 82.1 fL (ref 80.0–100.0)
MCV: 81.9 fL (ref 80.0–100.0)
Platelets: 211 10*3/uL (ref 150–400)
Platelets: 246 10*3/uL (ref 150–400)
RBC: 4.75 MIL/uL (ref 3.87–5.11)
RBC: 4.85 MIL/uL (ref 3.87–5.11)
RDW: 13.7 % (ref 11.5–15.5)
RDW: 13.5 % (ref 11.5–15.5)
WBC: 11.4 10*3/uL — ABNORMAL HIGH (ref 4.0–10.5)
WBC: 12.5 10*3/uL — ABNORMAL HIGH (ref 4.0–10.5)
nRBC: 0 % (ref 0.0–0.2)
nRBC: 0 % (ref 0.0–0.2)

## 2018-12-23 LAB — COMPREHENSIVE METABOLIC PANEL
ALT: 51 U/L — ABNORMAL HIGH (ref 0–44)
ALT: 44 U/L (ref 0–44)
AST: 49 U/L — ABNORMAL HIGH (ref 15–41)
AST: 47 U/L — ABNORMAL HIGH (ref 15–41)
Albumin: 1.8 g/dL — ABNORMAL LOW (ref 3.5–5.0)
Albumin: 1.9 g/dL — ABNORMAL LOW (ref 3.5–5.0)
Alkaline Phosphatase: 180 U/L — ABNORMAL HIGH (ref 38–126)
Alkaline Phosphatase: 162 U/L — ABNORMAL HIGH (ref 38–126)
Anion gap: 10 (ref 5–15)
Anion gap: 11 (ref 5–15)
BUN: 17 mg/dL (ref 6–20)
BUN: 17 mg/dL (ref 6–20)
CO2: 24 mmol/L (ref 22–32)
CO2: 22 mmol/L (ref 22–32)
Calcium: 7.6 mg/dL — ABNORMAL LOW (ref 8.9–10.3)
Calcium: 7.6 mg/dL — ABNORMAL LOW (ref 8.9–10.3)
Chloride: 99 mmol/L (ref 98–111)
Chloride: 102 mmol/L (ref 98–111)
Creatinine, Ser: 0.99 mg/dL (ref 0.44–1.00)
Creatinine, Ser: 1.17 mg/dL — ABNORMAL HIGH (ref 0.44–1.00)
GFR calc Af Amer: >60 mL/min (ref >60)
GFR calc Af Amer: >60 mL/min (ref >60)
GFR calc non Af Amer: >60 mL/min (ref >60)
GFR calc non Af Amer: 57 mL/min — ABNORMAL LOW (ref >60)
Glucose, Bld: 128 mg/dL — ABNORMAL HIGH (ref 70–99)
Glucose, Bld: 86 mg/dL (ref 70–99)
Potassium: 4.7 mmol/L (ref 3.5–5.1)
Potassium: 5.3 mmol/L — ABNORMAL HIGH (ref 3.5–5.1)
Sodium: 133 mmol/L — ABNORMAL LOW (ref 135–145)
Sodium: 135 mmol/L (ref 135–145)
Total Bilirubin: 0.2 mg/dL — ABNORMAL LOW (ref 0.3–1.2)
Total Bilirubin: 0.4 mg/dL (ref 0.3–1.2)
Total Protein: 4.7 g/dL — ABNORMAL LOW (ref 6.5–8.1)
Total Protein: 4.5 g/dL — ABNORMAL LOW (ref 6.5–8.1)

## 2018-12-23 LAB — GLUCOSE, CAPILLARY
Glucose-Capillary: 140 mg/dL — ABNORMAL HIGH (ref 70–99)
Glucose-Capillary: 130 mg/dL — ABNORMAL HIGH (ref 70–99)
Glucose-Capillary: 122 mg/dL — ABNORMAL HIGH (ref 70–99)
Glucose-Capillary: 124 mg/dL — ABNORMAL HIGH (ref 70–99)

## 2018-12-23 NOTE — Progress Notes (Signed)
Daily Antepartum Note  Admission Date: 12/15/2018 Current Date: 12/23/2018 10:50 AM  Lisa Webb is a 43 y.o. [id number] HD#10/POD#1 pLTCS @ 31/5 (EBL 317m) for NRFHTs..  Patient admitted for severe pre-eclampsia (BP, protein)  Pregnancy complicated by: Patient Active Problem List   Diagnosis Date Noted  . S/P cesarean section for NRFHT, uncontrolled severe range BP 12/22/2018  . Severe preeclampsia, delivered 12/15/2018  . COVID-19 virus detected 12/15/2018  . GDM (gestational diabetes mellitus) 11/25/2018  . Supervision of high risk pregnancy, antepartum 10/30/2018  . Late prenatal care, antepartum 10/30/2018  . Advanced maternal age in multigravida 10/30/2018  . History of preterm delivery, currently pregnant 10/30/2018    Overnight/24hr events:  Started on norvasc yesterday afternoon, came off Mg a few hours ago  Subjective:  No s/s of pre-eclampsia, no flatus yet, no nausea/vomiting/resp s/s, +voiding  Objective:    Current Vital Signs 24h Vital Sign Ranges  T 97.6 F (36.4 C) Temp  Avg: 97.4 F (36.3 C)  Min: 97 F (36.1 C)  Max: 97.6 F (36.4 C)  BP (!) 149/86 BP  Min: 136/73  Max: 179/105  HR 68 Pulse  Avg: 72.4  Min: 62  Max: 80  RR 18 Resp  Avg: 18.1  Min: 17  Max: 20  SaO2 95 % Simple Mask SpO2  Avg: 95 %  Min: 92 %  Max: 97 %       24 Hour I/O Current Shift I/O  Time Ins Outs 05/26 0701 - 05/27 0700 In: 3245.2 [P.O.:1440; I.V.:1805.2] Out: 2075 [Urine:2075] 05/27 0701 - 05/27 1900 In: 360 [P.O.:360] Out: 1450 [Urine:1450]   Patient Vitals for the past 24 hrs:  BP Temp Temp src Pulse Resp SpO2  12/23/18 0805 (!) 149/86 97.6 F (36.4 C) Oral 68 18 95 %  12/23/18 0351 (!) 141/89 - - 76 18 -  12/23/18 0325 - - - - - 96 %  12/23/18 0320 - - - - - 95 %  12/23/18 0315 - - - - - 96 %  12/23/18 0310 - - - - - 95 %  12/23/18 0305 - - - - - 95 %  12/23/18 0300 - - - - - 95 %  12/23/18 0255 - - - - - 94 %  12/23/18 0250 - - - - - 95 %  12/23/18 0245 - - -  - - 94 %  12/23/18 0240 - - - - - 95 %  12/23/18 0235 - - - - - 95 %  12/23/18 0230 - - - - - 94 %  12/23/18 0225 - - - - - 95 %  12/23/18 0220 - - - - - 95 %  12/23/18 0215 - - - - - 95 %  12/23/18 0210 - - - - - 94 %  12/23/18 0205 - - - - - 97 %  12/23/18 0200 - - - - - 95 %  12/23/18 0155 - - - - - 96 %  12/23/18 0154 - - - - - 96 %  12/23/18 0150 - - - - - 95 %  12/23/18 0145 - - - - - 96 %  12/23/18 0140 - - - - - 96 %  12/23/18 0135 - - - - - 95 %  12/23/18 0130 - - - - - 95 %  12/23/18 0125 - - - - - 95 %  12/23/18 0120 - - - - - 95 %  12/23/18 0115 - - - - - 96 %  12/23/18 0110 - - - - - 95 %  12/23/18 0105 - - - - - 95 %  12/23/18 0100 - - - - - 95 %  12/23/18 0055 - - - - - 96 %  12/23/18 0050 - - - - - 95 %  12/23/18 0032 (!) 154/86 97.6 F (36.4 C) Oral 78 18 96 %  12/23/18 0025 - - - - - 96 %  12/23/18 0020 - - - - - 96 %  12/23/18 0016 (!) 149/84 - - 74 - -  12/23/18 0015 - - - - - 96 %  12/23/18 0010 - - - - - 96 %  12/23/18 0005 - - - - - 94 %  12/23/18 0001 (!) 157/89 - - 74 - -  12/23/18 0000 - - - - - 95 %  12/22/18 2346 (!) 144/74 - - 72 - -  12/22/18 2345 - - - - - 97 %  12/22/18 2340 - - - - - 96 %  12/22/18 2335 - - - - - 97 %  12/22/18 2331 (!) 150/83 - - 71 - -  12/22/18 2330 - - - - - 95 %  12/22/18 2324 - - - - - 94 %  12/22/18 2320 - - - - - 96 %  12/22/18 2314 136/73 - - 76 - 95 %  12/22/18 2310 - - - - - 97 %  12/22/18 2305 - - - - - 96 %  12/22/18 2301 140/84 - - 80 - -  12/22/18 2300 - - - - - 96 %  12/22/18 2255 - - - - - 94 %  12/22/18 2250 - - - - - 96 %  12/22/18 2246 (!) 143/77 - - 78 - -  12/22/18 2245 - - - - - 96 %  12/22/18 2240 - - - - - 96 %  12/22/18 2235 - - - - - 95 %  12/22/18 2231 140/85 - - 76 - -  12/22/18 2230 - - - - - 95 %  12/22/18 2225 - - - - - 94 %  12/22/18 2220 - - - - - 97 %  12/22/18 2216 (!) 141/83 - - 75 - -  12/22/18 2215 - - - - - 93 %  12/22/18 2210 - - - - - 93 %  12/22/18 2205 - - - - -  93 %  12/22/18 2201 (!) 142/80 - - 80 - -  12/22/18 2200 - - - - - 93 %  12/22/18 2155 - - - - - 93 %  12/22/18 2146 (!) 151/85 - - 80 - -  12/22/18 2142 - - - - 20 93 %  12/22/18 2131 (!) 164/91 - - 75 - -  12/22/18 2105 - - - - - 93 %  12/22/18 2101 (!) 154/97 - - 70 18 -  12/22/18 2100 - - - - - 93 %  12/22/18 2055 - - - - - 92 %  12/22/18 2052 (!) 157/96 - - 68 - 92 %  12/22/18 2046 (!) 179/105 - - 66 - 94 %  12/22/18 2040 - - - - - 94 %  12/22/18 2035 - - - - - 93 %  12/22/18 2033 (!) 169/101 - - 62 - -  12/22/18 2030 - - - - 18 93 %  12/22/18 2021 (!) 166/98 - - 64 - 93 %  12/22/18 1900 - - - - 18 97 %  12/22/18 1800 - - - -  18 97 %  12/22/18 1700 - - - - 17 95 %  12/22/18 1600 - - - - 18 96 %  12/22/18 1518 (!) 154/87 (!) 97 F (36.1 C) Axillary 67 18 -  12/22/18 1515 (!) 154/87 - - - - -  12/22/18 1500 - - - - - 95 %  12/22/18 1400 - - - - 18 94 %  12/22/18 1300 - - - - 18 95 %  12/22/18 1200 - - - - 18 97 %  12/22/18 1101 (!) 158/97 (!) 97.3 F (36.3 C) Oral 63 18 96 %   UOP: >174m/hr  Physical exam: General: Well nourished, well developed female in no acute distress. Abdomen: rare BS, soft, nttp, obese, no ruq pain, c/d/i pressure dressing Cardiovascular: S1, S2 normal, no murmur, rub or gallop, regular rate and rhythm Respiratory: CTAB Extremities: no clubbing, cyanosis or edema Skin: Warm and dry.   Medications: Current Facility-Administered Medications  Medication Dose Route Frequency Provider Last Rate Last Dose  . amLODipine (NORVASC) tablet 5 mg  5 mg Oral Daily PAletha Halim MD   5 mg at 12/23/18 0925  . coconut oil  1 application Topical PRN Anyanwu, USallyanne Havers MD      . witch hazel-glycerin (TUCKS) pad 1 application  1 application Topical PRN Anyanwu, Ugonna A, MD       And  . dibucaine (NUPERCAINAL) 1 % rectal ointment 1 application  1 application Rectal PRN Anyanwu, Ugonna A, MD      . diphenhydrAMINE (BENADRYL) capsule 25 mg  25 mg Oral Q6H  PRN Anyanwu, Ugonna A, MD      . enoxaparin (LOVENOX) injection 55 mg  55 mg Subcutaneous Q24H Anyanwu, Ugonna A, MD   55 mg at 12/23/18 0522  . ferrous sulfate tablet 325 mg  325 mg Oral BID WC Anyanwu, Ugonna A, MD   325 mg at 12/23/18 0803  . gabapentin (NEURONTIN) capsule 300 mg  300 mg Oral BID Anyanwu, Ugonna A, MD   300 mg at 12/23/18 01610 . hydrALAZINE (APRESOLINE) injection 5 mg  5 mg Intravenous PRN Anyanwu, USallyanne Havers MD   5 mg at 12/22/18 2040   And  . hydrALAZINE (APRESOLINE) injection 10 mg  10 mg Intravenous PRN Anyanwu, USallyanne Havers MD   10 mg at 12/22/18 2134   And  . labetalol (NORMODYNE) injection 20 mg  20 mg Intravenous PRN Anyanwu, Ugonna A, MD       And  . labetalol (NORMODYNE) injection 40 mg  40 mg Intravenous PRN Anyanwu, Ugonna A, MD      . HYDROmorphone (DILAUDID) injection 1-2 mg  1-2 mg Intravenous Q3H PRN Anyanwu, Ugonna A, MD      . ibuprofen (ADVIL) tablet 800 mg  800 mg Oral Q6H Anyanwu, Ugonna A, MD   800 mg at 12/23/18 0625  . measles, mumps & rubella vaccine (MMR) injection 0.5 mL  0.5 mL Subcutaneous Once Anyanwu, Ugonna A, MD      . medroxyPROGESTERone (DEPO-PROVERA) injection 150 mg  150 mg Intramuscular Prior to discharge Anyanwu, Ugonna A, MD      . menthol-cetylpyridinium (CEPACOL) lozenge 3 mg  1 lozenge Oral Q2H PRN Anyanwu, Ugonna A, MD      . naloxone (NARCAN) injection 0.4 mg  0.4 mg Intravenous PRN HBarnet Glasgow MD       And  . sodium chloride flush (NS) 0.9 % injection 3 mL  3 mL Intravenous PRN HBarnet Glasgow MD      .  ondansetron (ZOFRAN) injection 4 mg  4 mg Intravenous Q8H PRN Barnet Glasgow, MD      . oxyCODONE-acetaminophen (PERCOCET/ROXICET) 5-325 MG per tablet 1-2 tablet  1-2 tablet Oral Q4H PRN Anyanwu, Sallyanne Havers, MD   2 tablet at 12/23/18 0803  . oxyCODONE-acetaminophen (PERCOCET/ROXICET) 5-325 MG per tablet 2 tablet  2 tablet Oral Q4H PRN Anyanwu, Ugonna A, MD      . prenatal multivitamin tablet 1 tablet  1 tablet Oral Q1200  Anyanwu, Ugonna A, MD   1 tablet at 12/22/18 1300  . senna-docusate (Senokot-S) tablet 2 tablet  2 tablet Oral Q24H Anyanwu, Sallyanne Havers, MD   2 tablet at 12/23/18 0025  . simethicone (MYLICON) chewable tablet 80 mg  80 mg Oral Q24H Anyanwu, Ugonna A, MD   80 mg at 12/23/18 0025  . simethicone (MYLICON) chewable tablet 80 mg  80 mg Oral PRN Anyanwu, Ugonna A, MD      . Tdap (BOOSTRIX) injection 0.5 mL  0.5 mL Intramuscular Once Anyanwu, Ugonna A, MD      . traMADol (ULTRAM) tablet 50 mg  50 mg Oral Q6H PRN Anyanwu, Ugonna A, MD      . zolpidem (AMBIEN) tablet 5 mg  5 mg Oral QHS PRN Osborne Oman, MD        Labs:  Recent Labs  Lab 12/21/18 0914 12/22/18 0341 12/23/18 0547  WBC 4.4 4.7 11.4*  HGB 13.4 14.0 12.7  HCT 41.4 42.9 39.0  PLT 213 206 211    Recent Labs  Lab 12/21/18 0914 12/22/18 0341 12/23/18 0547  NA 135 138 133*  K 5.5* 4.8 4.7  CL 106 110 99  CO2 21* 21* 24  BUN '14 17 17  ' CREATININE 0.86 0.99 0.99  CALCIUM 7.6* 8.0* 7.6*  PROT 4.5* 5.1* 4.7*  BILITOT 0.4 0.4 0.2*  ALKPHOS 197* 256* 180*  ALT 32 38 51*  AST 34 41 49*  GLUCOSE 73 84 128*   Results for LISSETT, FAVORITE (MRN [id number]) as of 12/23/2018 10:57  Ref. Range 12/21/2018 20:20 12/22/2018 03:39 12/22/2018 03:41 12/23/2018 03:59 12/23/2018 05:47  Glucose-Capillary Latest Ref Range: 70 - 99 mg/dL 98   140 (H)     Radiology: no new imaging  Assessment & Plan:  Pt stable *PP: routine care. Pumping. D/w pt re: [facility] tomorrow. O POS *Severe pre-x: BPs doing well on med. Will repeat cmp, cbc this afternoon. apap already being held. No need to re-Mg at this poing *GDM: AM fasting and 2h PP checks. Pt had juice this morning before check *Asymptomatic Coronavirus +: no current isssues *PPx: lovenox, scds *FEN/GI: dm diet, sliv *Dispo: likely pod#3  Durene Romans. MD Attending Center for [facility])

## 2018-12-23 NOTE — Lactation Note (Signed)
Lactation Consultation Note  Patient Name: Lisa Webb RSWNI'O Date: 12/23/2018   [facility] Follow Up Phone Call:  COVID 19+, Infant is 31+5 weeks and in the NICU.  Attempted to call mother in room; she did not answer her phone at this time.  Yesterday RN/LC set up DEBP and reviewed pumping and cleaning of pump parts.  Mother aware of hand expression and breast massage.  She has containers and labels for marking colostrum containers. I will have RN call me if mother has any further questions regarding pumping.    Charmagne Buhl R Pamella Samons 12/23/2018, 1:07 PM

## 2018-12-24 ENCOUNTER — Telehealth: Payer: Self-pay | Admitting: *Deleted

## 2018-12-24 ENCOUNTER — Encounter (HOSPITAL_COMMUNITY): Payer: Self-pay | Admitting: Obstetrics & Gynecology

## 2018-12-24 LAB — CBC
HCT: 39.2 % (ref 36.0–46.0)
Hemoglobin: 12.6 g/dL (ref 12.0–15.0)
MCH: 26.2 pg (ref 26.0–34.0)
MCHC: 32.1 g/dL (ref 30.0–36.0)
MCV: 81.5 fL (ref 80.0–100.0)
Platelets: 244 10*3/uL (ref 150–400)
RBC: 4.81 MIL/uL (ref 3.87–5.11)
RDW: 13.8 % (ref 11.5–15.5)
WBC: 11.4 10*3/uL — ABNORMAL HIGH (ref 4.0–10.5)
nRBC: 0 % (ref 0.0–0.2)

## 2018-12-24 LAB — COMPREHENSIVE METABOLIC PANEL
ALT: 37 U/L (ref 0–44)
AST: 38 U/L (ref 15–41)
Albumin: 1.8 g/dL — ABNORMAL LOW (ref 3.5–5.0)
Alkaline Phosphatase: 145 U/L — ABNORMAL HIGH (ref 38–126)
Anion gap: 4 — ABNORMAL LOW (ref 5–15)
BUN: 12 mg/dL (ref 6–20)
CO2: 30 mmol/L (ref 22–32)
Calcium: 7.9 mg/dL — ABNORMAL LOW (ref 8.9–10.3)
Chloride: 106 mmol/L (ref 98–111)
Creatinine, Ser: 0.87 mg/dL (ref 0.44–1.00)
GFR calc Af Amer: >60 mL/min (ref >60)
GFR calc non Af Amer: >60 mL/min (ref >60)
Glucose, Bld: 83 mg/dL (ref 70–99)
Potassium: 5.2 mmol/L — ABNORMAL HIGH (ref 3.5–5.1)
Sodium: 140 mmol/L (ref 135–145)
Total Bilirubin: 0.2 mg/dL — ABNORMAL LOW (ref 0.3–1.2)
Total Protein: 4.6 g/dL — ABNORMAL LOW (ref 6.5–8.1)

## 2018-12-24 LAB — GLUCOSE, CAPILLARY: Glucose-Capillary: 72 mg/dL (ref 70–99)

## 2018-12-24 MED ORDER — AMLODIPINE BESYLATE 5 MG PO TABS: 5.0000 mg | ORAL_TABLET | Freq: Every day | ORAL | 1 refills | Status: AC

## 2018-12-24 MED ORDER — IBUPROFEN 800 MG PO TABS: 800.0000 mg | ORAL_TABLET | Freq: Three times a day (TID) | ORAL | 0 refills | Status: AC | PRN

## 2018-12-24 MED ORDER — SIMETHICONE 80 MG PO CHEW: 80.0000 mg | CHEWABLE_TABLET | Freq: Four times a day (QID) | ORAL | 0 refills | Status: AC | PRN

## 2018-12-24 MED ORDER — PRENATAL VITAMINS 28-0.8 MG PO TABS: 1.0000 | ORAL_TABLET | Freq: Every day | ORAL | Status: AC

## 2018-12-24 MED ORDER — POLYETHYLENE GLYCOL 3350 17 G PO PACK: 17.0000 g | PACK | Freq: Every day | ORAL | 1 refills | Status: AC

## 2018-12-24 MED ORDER — OXYCODONE-ACETAMINOPHEN 5-325 MG PO TABS: 1.0000 | ORAL_TABLET | ORAL | 0 refills | Status: AC | PRN

## 2018-12-24 NOTE — Discharge Summary (Signed)
Discharge Summary   Admit Date: 12/15/2018 Discharge Date: 12/24/2018 Discharging Service: Obstetrics  Primary OBGYN: [facility] Admitting Physician: Reva Bores, MD  Discharge Physician: Vergie Living  Referring Provider: [facility]  Primary Care Provider: None  Admission Diagnoses: *Pregnancy at 30/5 *Severe pre-eclampsia (BP, proteinuria) *AMA *GDM  Discharge Diagnoses: *S/p primary cesarean *Severe pre-eclampsia (BP, proteinuria) *AMA *GDM *Asymptomatic covid diagnosis  Consult Orders: CONSULT TO LACTATION  NICU  Surgeries/Procedures Performed: 5/26: primary LTCS  History and Physical: Attestation signed by Levie Heritage, DO at 12/15/2018 10:41 PM  Attestation of Attending Supervision of Advanced Practitioner (PA/CNM/NP): Evaluation and management procedures were performed by the Advanced Practitioner under my supervision and collaboration.  I have reviewed the Advanced Practitioner's note and chart, and I agree with the management and plan.  Patient seen - severe preeclampsia. Start BMZ series, magnesium, labetalol.   COVID screening done. Patient asymptomatic.  Candelaria Celeste, DO Attending Physician Faculty Practice, [facility] All Collapse All   Obstetric History and Physical  Lisa Webb is a 43 y.o. [phone] with IUP at [redacted]w[redacted]d presenting for severe preeclampsia. Had new onset hypertension during her prenatal appointment on 5/15. Had labs done that day. Urine protein creatinine ratio was elevated at over 4000. Pt was in office today for BP check & to start 24 hour urine. BP in the office was 180s/100.  Presented to MAU & continues to have severe range BPs. Patient is asymptomatic at this time.  This pregnancy complicated by insulin controlled GDM, AMA, & late to prenatal care.    Prenatal Course Source of Care: [facility]  with onset of care at 26 weeks Pregnancy complications or risks:     Patient  Active Problem List   Diagnosis Date Noted  . GDM (gestational diabetes mellitus) 11/25/2018  . Glucosuria 11/18/2018  . Supervision of high risk pregnancy, antepartum 10/30/2018  . Late prenatal care, antepartum 10/30/2018  . Advanced maternal age in multigravida 10/30/2018  . History of preterm delivery, currently pregnant 10/30/2018    Prenatal labs and studies: ABO, Rh: O/Positive/-- (04/22 1150) Antibody: Negative (04/22 1150) Rubella: <0.90 (04/22 1150) RPR: Non Reactive (04/22 1150)  HBsAg: Negative (04/22 1150)  HIV: Non Reactive (04/22 1150)  GBS: n/a 2hr GTT:  A1C=8.0 on 4/22 Genetic screening normal Anatomy US normal       Past Medical History:  Diagnosis Date  . Abnormal uterine bleeding 2018  . Hernia, abdominal 2007  . Migraines    pt does not take meds  . Rectal bleeding 2018         Past Surgical History:  Procedure Laterality Date  . COLONOSCOPY  2018   rectal bleeding, Fam Hx of colon Ca  . ENDOMETRIAL BIOPSY  2018   normal  . HERNIA REPAIR  2007                    OB History  Gravida Para Term Preterm AB Living  5 3 2 1 1 3   SAB TAB Ectopic Multiple Live Births     1       3       # Outcome Date GA Lbr Len/2nd Weight Sex Delivery Anes PTL Lv  5 Current           4 Term 04/03/06 [redacted]w[redacted]d  3714 g F Vag-Spont EPI N LIV  3 SAB 02/14/05 [redacted]w[redacted]d         2 Term 09/19/99 [redacted]w[redacted]d  3345 g F Vag-Spont None N LIV  1 Preterm 09/08/94   2013 g M Vag-Spont None N LIV    Social History        Socioeconomic History  . Marital status: Single    Spouse name: Not on file  . Number of children: Not on file  . Years of education: Not on file  . Highest education level: Not on file  Occupational History    Employer: [employer]  Social Needs  . Financial resource strain: Not on file  . Food insecurity:    Worry: Never true    Inability: Never true  . Transportation needs:    Medical: No     Non-medical: No  Tobacco Use  . Smoking status: Never Smoker  . Smokeless tobacco: Never Used  Substance and Sexual Activity  . Alcohol use: Not Currently  . Drug use: Never  . Sexual activity: Not Currently  Lifestyle  . Physical activity:    Days per week: Not on file    Minutes per session: Not on file  . Stress: Not on file  Relationships  . Social connections:    Talks on phone: Not on file    Gets together: Not on file    Attends religious service: Not on file    Active member of club or organization: Not on file    Attends meetings of clubs or organizations: Not on file    Relationship status: Not on file  Other Topics Concern  . Not on file  Social History Narrative  . Not on file    Family History  Problem Relation Age of Onset  . Cancer Mother        colon  . Hypertension Father   . Dementia Father   . Diabetes Sister   . Thyroid disease Sister            Medications Prior to Admission  Medication Sig Dispense Refill Last Dose  . Calcium-Vitamin D-Vitamin K (CALCIUM SOFT CHEWS PO) Take 1 tablet by mouth daily.   Taking  . insulin NPH Human (NOVOLIN N) 100 UNIT/ML injection 40 units @ breakfast; 15 units @ bedtime. 10 mL 5   . insulin regular (NOVOLIN R) 100 units/mL injection 20 units @ breakfast; 15 units @ dinner 10 mL 5   . Insulin Syringe-Needle U-100 31G X 5/16" 1 ML MISC 1 Syringe by Does not apply route 4 (four) times daily. Check your blood sugar 4x/day: am fasting, 2 hours after breakfast/lunch/dinner 100 each 5   . Prenatal Vit-Fe Fumarate-FA (PRENATAL VITAMINS) 28-0.8 MG TABS Take by mouth.   Taking    No Known Allergies  Review of Systems: Negative except for what is mentioned in HPI.  Physical Exam: Patient Vitals for the past 24 hrs:  BP Temp Temp src Pulse Resp SpO2 Weight  12/15/18 1846 (!) 174/105 - - 78 - - -  12/15/18 1841 - 98.6 F (37 C) Oral - - - -  12/15/18 1840 (!) 173/108 - - 80 - - -   12/15/18 1813 (!) 165/108 98.1 F (36.7 C) Oral 78 18 100 % 111.3 kg    CONSTITUTIONAL: Well-developed, well-nourished female in no acute distress.  HENT:  Normocephalic, atraumatic, External right and left ear normal. Oropharynx is clear and moist EYES: Conjunctivae and EOM are normal. Pupils are equal, round, and reactive to light. No scleral icterus.  NECK: Normal range of motion, supple, no masses SKIN: Skin is warm and dry. No  rash noted. Not diaphoretic. No erythema. No pallor. NEUROLOGIC: Alert and oriented to person, place, and time. Normal reflexes, muscle tone coordination. No cranial nerve deficit noted. Normal DTRs, no clonus PSYCHIATRIC: Normal mood and affect. Normal behavior. Normal judgment and thought content. CARDIOVASCULAR: Normal heart rate noted, regular rhythm RESPIRATORY: Effort and breath sounds normal, no problems with respiration noted ABDOMEN: Soft, nontender, nondistended, gravid. MUSCULOSKELETAL: Normal range of motion. 2+ pitting edema of BLE. 2+ distal pulses.  FHT:  NST:  Baseline: 135 bpm, Variability: Good {> 6 bpm), Accelerations: Reactive and Decelerations: Absent   Pertinent Labs/Studies:   LabResultsLast24Hours  No results found for this or any previous visit (from the past 24 hour(s)).    Assessment : Lisa Webb is a 43 y.o. [phone] at 6435w5d being admitted for severe preeclampsia.   Plan: Admit to [facility] (NICU aware) Mag sulfate for seizure prophylaxis IV antihypertensive protocol being started in MAU Antenatal corticosteroids  Judeth HornLawrence, Erin, NP            Cosigned by: Levie HeritageStinson, Jacob J, DO at 12/15/2018 10:41 PM  Electronically signed by Judeth HornLawrence, Erin, NP at 12/15/2018 7:17 PM Electronically signed by Levie HeritageStinson, Jacob J, DO at 12/15/2018 10:41 PM    Hospital Course: Patient admitted and receive betamethasone course and Magnesium. She was managed with labetalol and had reassuring antenatal testing until the day of  delivery where NRFHTs were noted. Given this, patient underwent delivery via c-section, which was due to not being in labor. She had an uncomplicated postpartum/postop course, with 24hrs of Mg and was discharged to home on POD#2 because she desired early discharge. She was discharged to home on norvasc O POS/rubella non immune/rpr neg/hiv negative/hepB surface antigen negative/breast feeding/birth control: undecided/pap and hpv negative 2020   Patient was placed on insulin but her blood sugars were fine postpartum without need for medications  Patient had no s/s of COVID and was diagnosed via routine screening. Precautions were done during her stay and she was advised of her diagnosis and precautions when she goes home.   Discharge Exam:   Current Vital Signs 24h Vital Sign Ranges  T 98 F (36.7 C) Temp  Avg: 97.8 F (36.6 C)  Min: 97.6 F (36.4 C)  Max: 98 F (36.7 C)  BP (!) 144/82 BP  Min: 128/73  Max: 157/90  HR 77 Pulse  Avg: 74.4  Min: 71  Max: 78  RR 17 Resp  Avg: 17.6  Min: 17  Max: 18  SaO2 99 % Simple Mask SpO2  Avg: 97.7 %  Min: 96 %  Max: 99 %       24 Hour I/O Current Shift I/O  Time Ins Outs 05/27 0701 - 05/28 0700 In: 1920 [P.O.:1920] Out: 4375 [Urine:4375] No intake/output data recorded.    Patient Vitals for the past 24 hrs:  BP Temp Temp src Pulse Resp SpO2  12/24/18 0932 - - Oral - 17 99 %  12/24/18 0420 (!) 144/82 98 F (36.7 C) Oral 77 17 97 %  12/23/18 2348 (!) 148/80 97.7 F (36.5 C) Oral 78 - 96 %  12/23/18 2330 - - - - 18 -  12/23/18 1933 (!) 157/90 97.9 F (36.6 C) Oral 74 - 97 %  12/23/18 1700 (!) 154/90 97.6 F (36.4 C) Oral 72 18 98 %  12/23/18 1145 128/73 98 F (36.7 C) Oral 71 18 99 %    General appearance: Well nourished, well developed female in no acute distress.  Neck:  Supple, normal  appearance, and no thyromegaly  Cardiovascular: S1, S2 normal, no murmur, rub or gallop, regular rate and rhythm Respiratory:  Clear to auscultation  bilateral. Normal respiratory effort Abdomen: +BS, soft, nttp, nd, c/d/i incision Neuro/Psych:  Normal mood and affect.  Skin:  Warm and dry.   Discharge Disposition:  Home  Patient Instructions:  Standard   Results Pending at Discharge:  none  Discharge Medications: Allergies as of 12/24/2018   No Known Allergies     Medication List    STOP taking these medications   CALCIUM SOFT CHEWS PO   insulin NPH Human 100 UNIT/ML injection Commonly known as:  NOVOLIN N   insulin regular 100 units/mL injection Commonly known as:  NOVOLIN R   Insulin Syringe-Needle U-100 31G X 5/16" 1 ML Misc     TAKE these medications   amLODipine 5 MG tablet Commonly known as:  NORVASC Take 1 tablet (5 mg total) by mouth daily.   ibuprofen 800 MG tablet Commonly known as:  ADVIL Take 1 tablet (800 mg total) by mouth every 8 (eight) hours as needed.   oxyCODONE-acetaminophen 5-325 MG tablet Commonly known as:  PERCOCET/ROXICET Take 1-2 tablets by mouth every 4 (four) hours as needed for moderate pain.   polyethylene glycol 17 g packet Commonly known as:  MIRALAX / GLYCOLAX Take 17 g by mouth daily.   Prenatal Vitamins 28-0.8 MG Tabs Take 1 tablet by mouth daily. What changed:  how much to take   simethicone 80 MG chewable tablet Commonly known as:  MYLICON Chew 1 tablet (80 mg total) by mouth 4 (four) times daily as needed for flatulence.        Future Appointments  Date Time Provider Department Center  12/31/2018  2:00 PM [facility] NURSE [facility]  02/03/2019  8:50 AM [facility] LAB [facility]  02/03/2019  9:35 AM Anyanwu, Jethro Bastos, MD [facility]  BP and incision check  Cornelia Copa MD Attending Center for [facility])

## 2018-12-24 NOTE — Discharge Instructions (Signed)
Check your blood pressures twice per day Call the office for blood pressures greater than 155/105   Cesarean Delivery, Care After Refer to this sheet in the next few weeks. These instructions provide you with information on caring for yourself after your procedure. Your health care provider may also give you specific instructions. Your treatment has been planned according to current medical practices, but problems sometimes occur. Call your health care provider if you have any problems or questions after you go home. HOME CARE INSTRUCTIONS  Only take over-the-counter or prescription medications as directed by your health care provider.  Do not drink alcohol, especially if you are breastfeeding or taking medication to relieve pain.  Do not  smoke tobacco.  Continue to use good perineal care. Good perineal care includes:  Wiping your perineum from front to back.  Keeping your perineum clean.  Check your surgical cut (incision) daily for increased redness, drainage, swelling, or separation of skin.  Shower and clean your incision gently with soap and water every day, by letting warm and soapy water run over the incision, and then pat it dry. If your health care provider says it is okay, leave the incision uncovered. Use a bandage (dressing) if the incision is draining fluid or appears irritated. If the adhesive strips across the incision do not fall off within 7 days, carefully peel them off, after a shower.  Hug a pillow when coughing or sneezing until your incision is healed. This helps to relieve pain.  Do not use tampons, douches or have sexual intercourse, until your health care provider says it is okay.  Wear a well-fitting bra that provides breast support.  Limit wearing support panties or control-top hose.  Drink enough fluids to keep your urine clear or pale yellow.  Eat high-fiber foods such as whole grain cereals and breads, brown rice, beans, and fresh fruits and  vegetables every day. These foods may help prevent or relieve constipation.  Resume activities such as climbing stairs, driving, lifting, exercising, or traveling as directed by your health care provider.  Try to have someone help you with your household activities and your newborn for at least a few days after you leave the hospital.  Rest as much as possible. Try to rest or take a nap when your newborn is sleeping.  Increase your activities gradually.  Do not lift more than 15lbs until directed by a provider.  Keep all of your scheduled postpartum appointments. It is very important to keep your scheduled follow-up appointments. At these appointments, your health care provider will be checking to make sure that you are healing physically and emotionally. SEEK MEDICAL CARE IF:   You are passing large clots from your vagina. Save any clots to show your health care provider.  You have a foul smelling discharge from your vagina.  You have trouble urinating.  You are urinating frequently.  You have pain when you urinate.  You have a change in your bowel movements.  You have increasing redness, pain, or swelling near your incision.  You have pus draining from your incision.  Your incision is separating.  You have painful, hard, or reddened breasts.  You have a severe headache.  You have blurred vision or see spots.  You feel sad or depressed.  You have thoughts of hurting yourself or your newborn.  You have questions about your care, the care of your newborn, or medications.  You are dizzy or light-headed.  You have a rash.  You have  pain, redness, or swelling at the site of the removed intravenous access (IV) tube.  You have nausea or vomiting.  You stopped breastfeeding and have not had a menstrual period within 12 weeks of stopping.  You are not breastfeeding and have not had a menstrual period within 12 weeks of delivery.  You have a fever. SEEK IMMEDIATE  MEDICAL CARE IF:  You have persistent pain.  You have chest pain.  You have shortness of breath.  You faint.  You have leg pain.  You have stomach pain.  Your vaginal bleeding saturates 2 or more sanitary pads in 1 hour. MAKE SURE YOU:   Understand these instructions.  Will watch your condition.  Will get help right away if you are not doing well or get worse. Document Released: 04/06/2002 Document Revised: 11/29/2013 Document Reviewed: 03/11/2012 [facility] Patient Information 2015 [place], [place]. This information is not intended to replace advice given to you by your health care provider. Make sure you discuss any questions you have with your health care provider.

## 2018-12-24 NOTE — Lactation Note (Signed)
This note was copied from a baby's chart. Lactation Consultation Note  Patient Name: Lisa Webb MWNUU'V Date: 12/24/2018   [facility] phoned in to room to speak to [facility] prior to her discharge from [facility].  Baby is 11 hrs old and in the NICU.  Mom has been double pumping every 3 hrs, and has some colostrum in refrigerator for baby.    Reviewed importance of frequent pumping, 8-12 times per 24 hrs.  Mom does not have a DEBP at home.  Mom states she does not have WIC.  Mom states she was going to buy a pump (recommended Medela or Spectra brands).  Information on rental program from gift shop in lobby.  Mom will be visiting baby in the NICU after 2 negative tests.  Mom aware of Symphony pump available in baby's room at that time.  Reviewed breast massage and hand expression.  Mom denies any questions about pumping.    Handout on handling EBM of a Covid + Mom given to Mom 2 days ago.  Encouraged good hand washing and face masking while pumping. Engorgement prevention and treatment reviewed.  Offered to come into room and help her with pumping, but Mom declined saying she is fine.  Reviewed importance of good fit for flange where nipple moves freely, no pain, and no compression during pumping.  Encouraged Mom to use organic coconut oil to lubricate prior to pumping if nipples become sore.    Mom knows she can call Lactation prn.    [name] 12/24/2018, 12:27 PM

## 2018-12-24 NOTE — Telephone Encounter (Signed)
Per chart review appear blood pressure cuff was given to patinet. I called Rhealyn and she verifies she has a bp cuff. I explained we will be doing a virtual visit on 6/4 for bp check and doctor wanted to be sure she had a cuff. She voices understanding. Linda,RN

## 2018-12-24 NOTE — Progress Notes (Signed)
Patient discharged home with husband. Prescriptions reviewed; educated about hypertension in pregnancy; medications discussed; admission discussed; discharge instructions reviewed; follow-up care reviewed; pain management discussed. Patient/caregiver verbalized understanding.

## 2018-12-24 NOTE — Telephone Encounter (Signed)
-----   Message from Vivien Rota sent at 12/24/2018 11:21 AM EDT ----- Regarding: FW: needs bp cuff for virtual pp bp check in 3-5 days. cancel appt with dove for next week. thanks Please make sure patient has BP cuff ----- Message ----- From: [place] Bing, MD Sent: 12/24/2018   8:35 AM EDT To: [facility] Clinical Pool, [facility] Admin Pool Subject: needs bp cuff for virtual pp bp check in 3-5#

## 2018-12-25 ENCOUNTER — Encounter (HOSPITAL_COMMUNITY): Payer: Self-pay

## 2018-12-25 ENCOUNTER — Ambulatory Visit (HOSPITAL_COMMUNITY): Payer: Self-pay

## 2018-12-27 ENCOUNTER — Encounter (INDEPENDENT_AMBULATORY_CARE_PROVIDER_SITE_OTHER): Payer: Self-pay

## 2018-12-28 ENCOUNTER — Encounter (INDEPENDENT_AMBULATORY_CARE_PROVIDER_SITE_OTHER): Payer: Self-pay

## 2018-12-28 ENCOUNTER — Telehealth: Payer: Self-pay | Admitting: Obstetrics & Gynecology

## 2018-12-29 ENCOUNTER — Encounter (INDEPENDENT_AMBULATORY_CARE_PROVIDER_SITE_OTHER): Payer: Self-pay

## 2018-12-30 ENCOUNTER — Encounter (INDEPENDENT_AMBULATORY_CARE_PROVIDER_SITE_OTHER): Payer: Self-pay

## 2018-12-31 ENCOUNTER — Telehealth: Payer: Self-pay | Admitting: Emergency Medicine

## 2018-12-31 ENCOUNTER — Encounter (INDEPENDENT_AMBULATORY_CARE_PROVIDER_SITE_OTHER): Payer: Self-pay

## 2018-12-31 ENCOUNTER — Other Ambulatory Visit: Payer: Self-pay

## 2018-12-31 DIAGNOSIS — Z0130 Encounter for examination of blood pressure without abnormal findings: Secondary | ICD-10-CM

## 2018-12-31 NOTE — Progress Notes (Signed)
I connected with  Lisa Webb on 12/31/18 at  2:00 PM EDT by telephone and verified that I am speaking with the correct person using two identifiers.   I discussed the limitations, risks, security and privacy concerns of performing an evaluation and management service by telephone and the availability of in person appointments. I also discussed with the patient that there may be a patient responsible charge related to this service. The patient expressed understanding and agreed to proceed.   Pt reports taking her blood pressure medication norvasc 5mg  as directed but she has not taken it today. After pt took her blood pressure the first time, she stated it was 120/95. Pt instructed to take her blood pressure medication and she would receive a phone call in an hour and a half. When calling pt back she reported a blood pressure of 125/91. After consulting with Dr. Debroah Loop, pt was instructed to continue current blood pressure medication regimen and bp would be reevaluated at post partum visit in July. Pt was taught the importance of taking her blood pressure medication every day and what values and symptoms to report for evaluation. Pt verbalized understanding and had no further questions.   Lisa Alexander, RN 12/31/2018  2:36 PM

## 2019-01-02 ENCOUNTER — Encounter (INDEPENDENT_AMBULATORY_CARE_PROVIDER_SITE_OTHER): Payer: Self-pay

## 2019-01-05 ENCOUNTER — Encounter (INDEPENDENT_AMBULATORY_CARE_PROVIDER_SITE_OTHER): Payer: Self-pay

## 2019-01-21 ENCOUNTER — Ambulatory Visit: Payer: Self-pay

## 2019-01-21 NOTE — Lactation Note (Signed)
This note was copied from a baby's chart. Lactation Consultation Note  Patient Name: Lisa Webb OEVOJ'J Date: 01/21/2019 Reason for consult: Follow-up assessment;Preterm <34wks;NICU baby;1st time breastfeeding;Infant < 6lbs  Visited mother in NICU to assist with latching.  This is a preterm baby born at 31+5 weeks with a CGA of 36+0 weeks.  This will be baby's first attempt at breast feeding.  This is mother's first time breast feeding.  Encouraged STS for feeding but mother preferred to leave onesie on baby.  She did remove the blanket.    Mother's breasts are large, soft and non tender and nipples are large, everted and intact.  Attempted to assist in the cross cradle position on the left breast.  Baby was able to open wide and latch with a few sucks.  She pulled back after a couple of sucks.  Repeated attempts made with the same result.  She got latched well with proper body alignment but was not interested in sucking.  Offered to try the football hold and mother accepted.  Again, baby was able to demonstrate a wide gape, flanged lips and a couple of sucks before becoming restless and pulling off the breast.  Attempted to burp and then tried the football hold on the right breast.  Since she continued to push back I suggested mother place her on her chest for her gavage feeding.  RN in room to finish feeding.  Mother stated that she visits every day.  I encouraged her to call ahead when she plans to visit if she will be needing lactation assistance.  Suggested she continue lots of STS while visiting and offering the breast first before gavage feeding if baby shows an interest in feeding.  Mother does not have any questions/concerns related to pumping.  Praised her efforts.  Education basics related to breast feeding completed.  Mother will call for assistance as needed.  RN updated.   Maternal Data Has patient been taught Hand Expression?: Yes Does the patient have breastfeeding  experience prior to this delivery?: No  Feeding Feeding Type: Breast Fed  LATCH Score Latch: Repeated attempts needed to sustain latch, nipple held in mouth throughout feeding, stimulation needed to elicit sucking reflex.  Audible Swallowing: None  Type of Nipple: Everted at rest and after stimulation  Comfort (Breast/Nipple): Soft / non-tender  Hold (Positioning): Assistance needed to correctly position infant at breast and maintain latch.  LATCH Score: 6  Interventions Interventions: Breast feeding basics reviewed;Assisted with latch;Skin to skin;Breast massage;Hand express;Breast compression;DEBP;Position options;Support pillows  Lactation Tools Discussed/Used WIC Program: No   Consult Status Consult Status: PRN Date: 01/21/19 Follow-up type: Call as needed    Maison Agrusa R Itsel Opfer 01/21/2019, 5:22 PM

## 2019-02-01 ENCOUNTER — Other Ambulatory Visit: Payer: Self-pay | Admitting: *Deleted

## 2019-02-01 DIAGNOSIS — O24429 Gestational diabetes mellitus in childbirth, unspecified control: Secondary | ICD-10-CM

## 2019-02-02 ENCOUNTER — Telehealth: Payer: Self-pay | Admitting: Obstetrics & Gynecology

## 2019-02-02 ENCOUNTER — Telehealth: Payer: Self-pay | Admitting: Student

## 2019-02-02 NOTE — Telephone Encounter (Signed)
The patient called in to verify information for appointment. Completed the pre-screen. The patient answered no to COVID19 symptoms and/or being previously diagnosed. Informed the patient of the wearing a face mask, sanitizing hands at the sanitizing station upon entering our office, and no visitors or children are allowed due to the [place] restrictions. The patient verbalized understanding.  Also scheduled an lactation appointment.

## 2019-02-02 NOTE — Telephone Encounter (Signed)
Attempted to call patient and instruct her about her visit for 02/03/2019. Left a message on her voicemail to call us back for appointment information.  °

## 2019-02-03 ENCOUNTER — Encounter: Payer: Self-pay | Admitting: Obstetrics & Gynecology

## 2019-02-03 ENCOUNTER — Telehealth: Payer: Self-pay | Admitting: *Deleted

## 2019-02-03 ENCOUNTER — Ambulatory Visit (INDEPENDENT_AMBULATORY_CARE_PROVIDER_SITE_OTHER): Payer: Medicaid Other | Admitting: Obstetrics & Gynecology

## 2019-02-03 ENCOUNTER — Other Ambulatory Visit: Payer: Medicaid Other

## 2019-02-03 ENCOUNTER — Other Ambulatory Visit: Payer: Self-pay

## 2019-02-03 VITALS — BP 126/71 | HR 93

## 2019-02-03 DIAGNOSIS — Z3042 Encounter for surveillance of injectable contraceptive: Secondary | ICD-10-CM

## 2019-02-03 DIAGNOSIS — Z20822 Contact with and (suspected) exposure to covid-19: Secondary | ICD-10-CM

## 2019-02-03 DIAGNOSIS — U071 COVID-19: Secondary | ICD-10-CM

## 2019-02-03 DIAGNOSIS — Z8632 Personal history of gestational diabetes: Secondary | ICD-10-CM

## 2019-02-03 DIAGNOSIS — R7303 Prediabetes: Secondary | ICD-10-CM

## 2019-02-03 DIAGNOSIS — Z3202 Encounter for pregnancy test, result negative: Secondary | ICD-10-CM

## 2019-02-03 DIAGNOSIS — O1414 Severe pre-eclampsia complicating childbirth: Secondary | ICD-10-CM

## 2019-02-03 DIAGNOSIS — O24429 Gestational diabetes mellitus in childbirth, unspecified control: Secondary | ICD-10-CM

## 2019-02-03 LAB — POCT PREGNANCY, URINE: Preg Test, Ur: NEGATIVE

## 2019-02-03 MED ORDER — MEDROXYPROGESTERONE ACETATE 150 MG/ML IM SUSP
150.0000 mg | Freq: Once | INTRAMUSCULAR | Status: AC
  Administered 2019-02-03: 10:00:00 150 mg via INTRAMUSCULAR

## 2019-02-03 NOTE — Telephone Encounter (Signed)
LM for pt to call back @ [phone] M-F 7a-7p to schedule covid testing. Order placed  

## 2019-02-03 NOTE — Patient Instructions (Signed)
Return to clinic for any scheduled appointments or for any gynecologic concerns as needed.   

## 2019-02-03 NOTE — Telephone Encounter (Signed)
-----   Message from Alric Seton, [place] sent at 02/03/2019  9:57 AM EDT ----- Patient requests to be retested for covid-19 so that her employer allows her to return to work.

## 2019-02-03 NOTE — Progress Notes (Signed)
Subjective:     Lisa Webb is a 43 y.o. [id number] female who presents for a postpartum visit. She is 7 weeks postpartum following a PLTCS at 31 weeks for non-reassuring fetal status and severe preeclampsia; also had asymptomatic COVID infection. I have fully reviewed the prenatal and intrapartum course, patient also had insulin dependent GDM. Postpartum course has been unremarkable, no development of respiratory symptoms and no worsening of HTN. Baby's course has been unremarkable, in the [place]. Baby is feeding by breast. Bleeding staining only. Bowel function is normal. Bladder function is normal. Patient is not sexually active. Desired contraception method is Depo-Provera injections. Postpartum depression screening: negative.  The following portions of the patient's history were reviewed and updated as appropriate: allergies, current medications, past family history, past medical history, past social history, past surgical history and problem list. Normal pap and negative HPV   Review of Systems Pertinent items noted in HPI and remainder of comprehensive ROS otherwise negative.   Objective:    BP 126/71   Pulse 93   LMP 05/12/2018 (Approximate)   General:  alert and no distress   Breasts:  inspection negative, no nipple discharge or bleeding, no masses or nodularity palpable  Lungs: clear to auscultation bilaterally  Heart:  regular rate and rhythm  Abdomen: soft, non-tender; bowel sounds normal; no masses,  no organomegaly. Incision healing well, one small area of superficial dehiscence in the center treated with silver nitrate.  Pelvic:  not evaluated      Assessment:     Normal postpartum exam, BP normal.  No symptoms of COVID.   Plan:    1. Contraception: Depo-Provera injections started today, will return in 3 months 2. Severe PEC: Normal BP, no need for further medications 3. COVID infection: Patient says her work wants documentation of negative test before resuming work. She was  told to follow up with community testing sites to get this result, information given to her. Also sent her name to the community testing pool to contract her. Turnaround time is usually 72 hours.  4. History of GDM: 2 hr postpartum GTT being done today, will follow up results and manage accordingly. 5. Follow up in: 3 months for Depo Provera or as needed.    Verita Schneiders, MD, [place] for [employer], [place]

## 2019-02-04 ENCOUNTER — Telehealth: Payer: Self-pay | Admitting: *Deleted

## 2019-02-04 LAB — GLUCOSE TOLERANCE, 2 HOURS
Glucose, 2 hour: 193 mg/dL — ABNORMAL HIGH (ref 65–139)
Glucose, GTT - Fasting: 110 mg/dL — ABNORMAL HIGH (ref 65–99)

## 2019-02-04 NOTE — Telephone Encounter (Signed)
Patient called, left VM to return call to schedule covid testing to [phone] between 0700-1900 Monday-Friday. This is the 3rd attempt to contact patient, will route to ordering provider.

## 2019-02-04 NOTE — Telephone Encounter (Signed)
-----   Message from Nicole S Capel, CMA sent at 02/03/2019  9:57 AM EDT ----- Patient requests to be retested for covid-19 so that her employer allows her to return to work.   

## 2019-02-04 NOTE — Telephone Encounter (Signed)
Attempted to call patient to schedule for covid-19. Left message for patient to call back to schedule.

## 2019-02-04 NOTE — Telephone Encounter (Signed)
Called Patient advised her of the process of the covid testing gave her the phone number to call to schedule it. Patient verbalized understanding took down the number and ended the call.

## 2019-02-05 ENCOUNTER — Encounter: Payer: Self-pay | Admitting: Obstetrics & Gynecology

## 2019-02-05 DIAGNOSIS — Z8632 Personal history of gestational diabetes: Secondary | ICD-10-CM | POA: Insufficient documentation

## 2019-02-05 MED ORDER — METFORMIN HCL 500 MG PO TABS: ORAL_TABLET | ORAL | 5 refills | Status: AC

## 2019-02-05 NOTE — Progress Notes (Signed)
    Result Note  Results for orders placed or performed in visit on 02/03/19 (from the past 72 hour(s))  Glucose tolerance, 2 hours     Status: Abnormal   Collection Time: 02/03/19  9:24 AM  Result Value Ref Range   Glucose, GTT - Fasting 110 (H) 65 - 99 mg/dL   Glucose, 2 hour 193 (H) 65 - 139 mg/dL    Patient has prediabetes (Type II diabetes diagnosed if FBS >126 or 2 hr value >200).  After consultation with Dr. Loma Boston (Family Medicine/[facility]/OB Specialist), he recommended starting patient on Metformin 500 mg daily and build up to twice a day in one week. Patient should also be advised to modify diet and lifestyle.  She will remain on Metformin and be referred to a primary care physician, she needs HgA1C in 3 months.  Metformin can be used to prevent conversion from prediabetes to full Type II diabetes.   Orders placed: - metFORMIN (GLUCOPHAGE) 500 MG tablet; Take one tablet by mouth daily with breakfast, then in one week increase to one tablet by mouth twice a daily (with breakfast and dinner)  Dispense: 60 tablet; Refill: 5 - Ambulatory referral to Internal Medicine  Please call to inform patient of results and recommendations; advise to pick up prescription and to take as recommended. Also please arrange for referral to PCP.   Verita Schneiders, MD

## 2019-02-05 NOTE — Telephone Encounter (Signed)
Patient is calling back to schedule covid testing. [place]- [phone]

## 2019-02-05 NOTE — Telephone Encounter (Signed)
Contacted pt and she has been scheduled for Monday at the [facility] testing site at 8:15. Pt is aware to remain in her car and to wear a mask. Pt's mychart is active. Pt understood and had no additional questions at this time. Nothing further is needed. Order was already placed by previous triage nurse.

## 2019-02-08 ENCOUNTER — Other Ambulatory Visit: Payer: Self-pay

## 2019-02-08 ENCOUNTER — Ambulatory Visit: Payer: Self-pay

## 2019-02-08 DIAGNOSIS — Z20822 Contact with and (suspected) exposure to covid-19: Secondary | ICD-10-CM

## 2019-02-08 NOTE — Lactation Note (Signed)
This note was copied from a baby's chart. Lactation Consultation Note  Patient Name: Lisa Webb WUJWJ'XToday's Date: 02/08/2019     02/08/2019  Name: Lisa Webb MRN: [id number] Date of Birth: 12/22/2018 Gestational Age: Gestational Age: 4535w5d Birth Weight: 49.7 oz Weight today:    6 pounds 5.4 ounces (2876 grams) with clean newborn diaper  46 week old preterm infant presents today with mom for feeding assessment. Infant born at 31w 5d and is now 638w 4d AGA.   Infant has gained 396 grams in the last 10 days with an average daily weight gain of 39 grams a day.   Infant self awakens to feed about every 3 hours. Infant is tolerating feedings well. Infant bottle feeding with the Avent nipple.   Mom is concerned with milk supply. Reviewed supply and demand and importance of pumping more often to maintain milk supply. Reviewed preterm infants and them learning to BF and that it takes a little time for them to learn. Mom with large nipples and infant with smaller mouth. Mom with some pinching with BF when infant latches. Mom is using her stored milk to feed infant.   Mom returns to work on 7/27. Mom usually works 4 hours shifts. Reviewed pumping before and after her shift. She does not feel like she will be able to pump at work. Discussed talking with Ped as needed to review if she qualifies for FMLA.   Infant to follow up with Ped Dr. Hampton AbbotKristin Dickson in the next month. Mom wanting to see if she would like a different formula, discussed talking with Ped prior to taking. Mom to call back for Lactation as needed.    General Information: Mother's reason for visit: Feeding assessment, PT infant Consult: Initial Lactation consultant: Jasmine DecemberSharon  RN,IBCLC Breastfeeding experience: not latching and feeding right now Maternal medical conditions: Gestational diabetes mellitus, Pregnancy induced hypertension Maternal medications: Pre-natal vitamin,  Other(Calcium)  Breastfeeding History: Frequency of breast feeding: not latching    Supplementation: Supplement method: bottle(Avent) Brand: Similac(Neosure- 1/4 tsp /45 ml or 1/2 tsp/ 90 ml)       Breast milk volume: 2-3 ounces with Neosure powder Breast milk frequency: every 2-3 hours   Pump type: Symphony Pump frequency: 4 x in 24 hours Pump volume: 6-7 ounces  Infant Output Assessment: Voids per 24 hours: 8 Urine color: Clear yellow Stools per 24 hours: 8 Stool color: Yellow  Breast Assessment: Breast: Soft, Compressible Nipple: Erect, Other(Large diameter nipples) Pain level: 0 Pain interventions: Bra, Breast pump, Lanolin  Feeding Assessment: Infant oral assessment: WNL   Positioning: Cross cradle Latch: 0 - Too sleepy or reluctant, no latch achieved, no sucking elicited. Audible swallowing: 0 - None Type of nipple: 2 - Everted at rest and after stimulation Comfort: 2 - Soft/non-tender Hold: 1 - Assistance needed to correctly position infant at breast and maintain latch LATCH score: 5 Latch assessment: Shallow Lips flanged: Yes Suck assessment: Displays both   Pre-feed weight: 2876 grams        Additional Feeding Assessment:                                    Totals: Total amount transferred: 0 Total supplement given: 50 ml EBM with Neosure powder Total amount pumped post feed: did not pump   Plan:   1. Offer infant the breast 3-4 x a day to practice. Limit feedings to 10-20 minutes at  the breast. 2. Feed her Skin to skin 3. Keep infant awake at the breast as needed 4. Massage/compress breast with feeding 5. Offer infant a bottle of pumped breast milk or formula before latch if she is really hungry and then try to latch 6. Offer infant a bottle of pumped breast milk or formula after breast feeding. Feed her until she is satisfied. 7. Infant needs about 53-70 ml (2-2.5 ounces) for 8 feedings a day or 420-560 ml (14-19 ounces) in  24 hours. Infant may take more or less depending on how often she feeds. Feed infant until she is satisfied.  8. Continue pumping about 7-8 x a day for 15-20 minutes to protect milk supply until infant is able to transfer better on her own. Try not to go more than 5-6 hours at night with no pumping.  9. Keep up the good work 10. Thank you for allowing me to assist you today 11. Please call with any questions/concerns as needed 12. Follow up with Lactation as needed.   Debby Freiberg  RN, IBCLC                                                      Debby Freiberg  02/08/2019, 9:27 AM

## 2019-02-09 ENCOUNTER — Telehealth (INDEPENDENT_AMBULATORY_CARE_PROVIDER_SITE_OTHER): Payer: Self-pay | Admitting: Lactation Services

## 2019-02-09 DIAGNOSIS — R7303 Prediabetes: Secondary | ICD-10-CM

## 2019-02-09 NOTE — Telephone Encounter (Signed)
Called and spoke with patient to let her know that her post partum GTT was abnormal and she has Prediabetes. She is aware that she needs to go and pick up her Metformin and begin taking daily at Breakfast and in 1 week add an evening dosage. Pt reports she does not want to take Injections. Informed her this is an oral pill. Informed pt she should be called by [facility] for follow up and that she needs a repeat A1C in 3 months. Pt voiced understanding.

## 2019-02-09 NOTE — Telephone Encounter (Signed)
-----   Message from Osborne Oman, MD sent at 02/05/2019  1:42 PM EDT ----- Patient has prediabetes (Type II diabetes diagnosed if FBS >126 or 2 hr value >200).  After consultation with Dr. Loma Boston (Family Medicine/[facility]/OB Specialist), he recommended starting patient on Metformin 500 mg daily and build up to twice a day in one week. Patient should also be advised to modify diet and lifestyle.  She will remain on Metformin and be referred to a primary care physician, she needs HgA1C in 3 months.  Metformin can be used to prevent conversion from prediabetes to full Type II diabetes.  Placed orders for Metformin and referral to Internal Medicine, please help to arrange for PCP referral.  Please call to inform patient of results and recommendations; advise to pick up prescription and to take as recommended.    Verita Schneiders, MD

## 2019-02-10 ENCOUNTER — Telehealth (HOSPITAL_COMMUNITY): Payer: Self-pay | Admitting: Lactation Services

## 2019-02-10 NOTE — Telephone Encounter (Signed)
Mom called from hospital lobby needing pumping parts. LC didn't have individual parts mom needed. Mom was provided w/pump kit for DEBP.  Mom had baby that was in NICU and needed to pump.

## 2019-02-12 DIAGNOSIS — Z029 Encounter for administrative examinations, unspecified: Secondary | ICD-10-CM

## 2019-02-12 LAB — NOVEL CORONAVIRUS, NAA: SARS-CoV-2, NAA: NOT DETECTED

## 2019-04-21 ENCOUNTER — Ambulatory Visit: Payer: Medicaid Other

## 2019-06-11 IMAGING — US US MFM OB DETAIL +14 WK
1 series · 13 of 28 positions shown · non-contrast
Comparison: none

[Series 1: us mfm ob detail +14 wk · 13 of 96 slices shown]
[im 4/96]
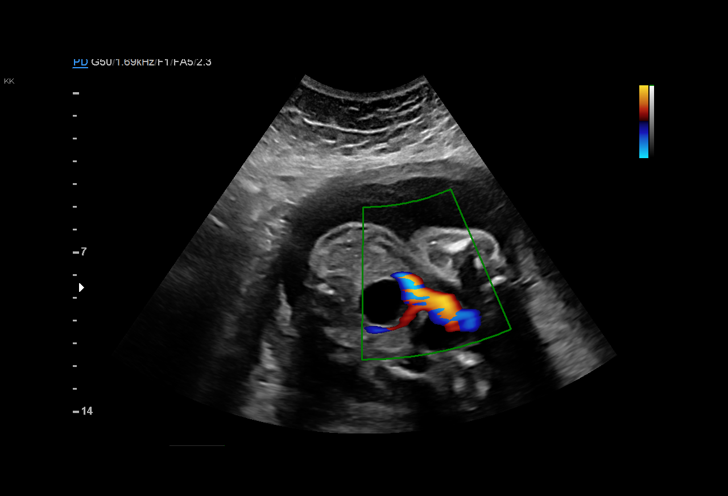
[im 11/96]
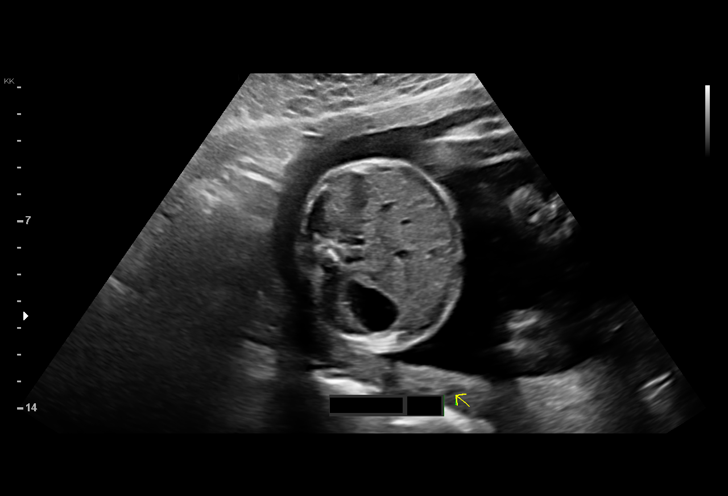
[im 18/96]
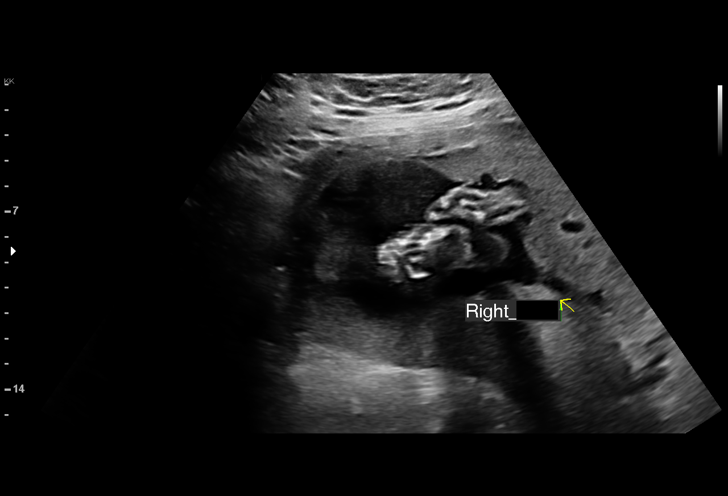
[im 25/96]
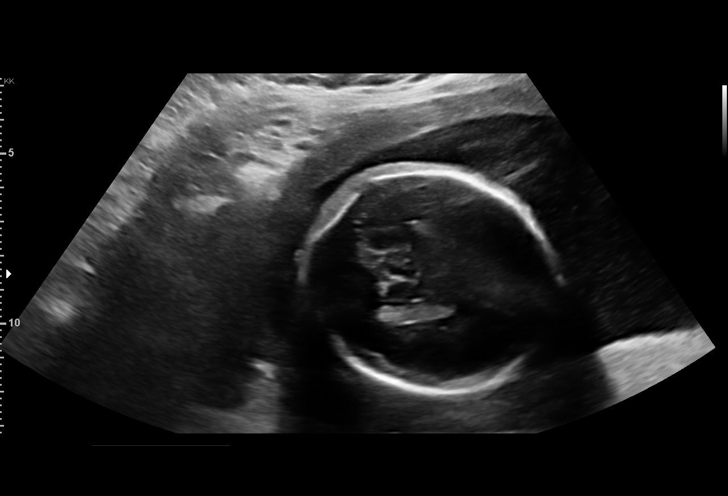
[im 32/96]
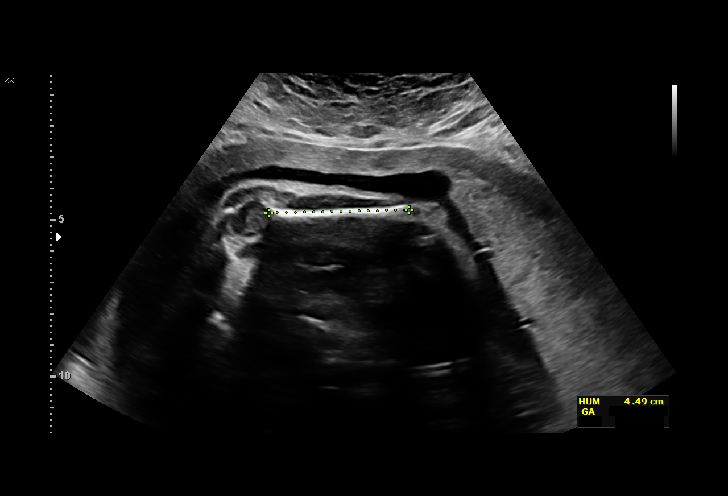
[im 39/96]
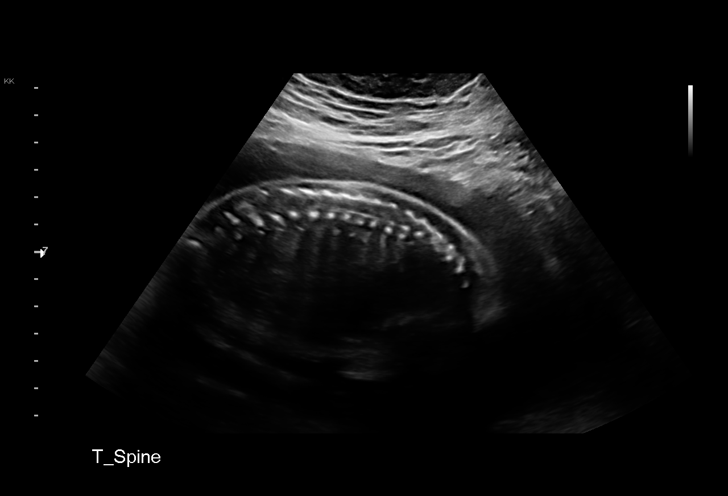
[im 50/96]
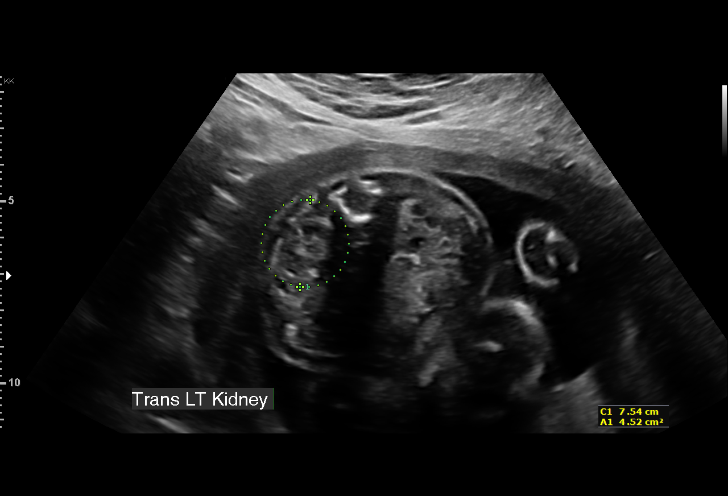
[im 57/96]
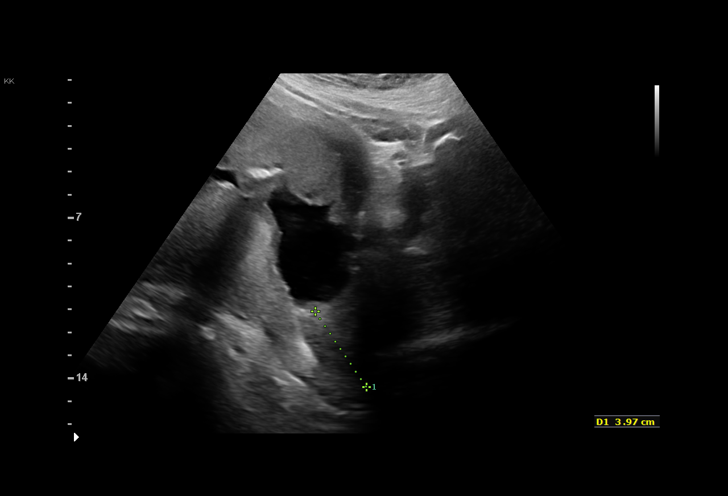
[im 64/96]
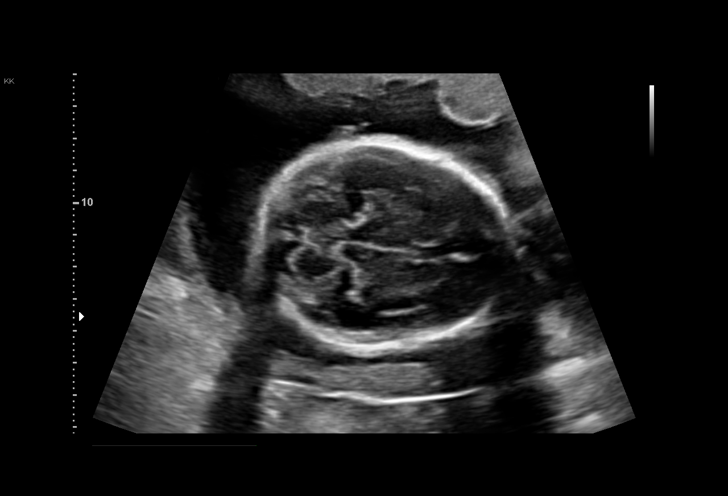
[im 71/96]
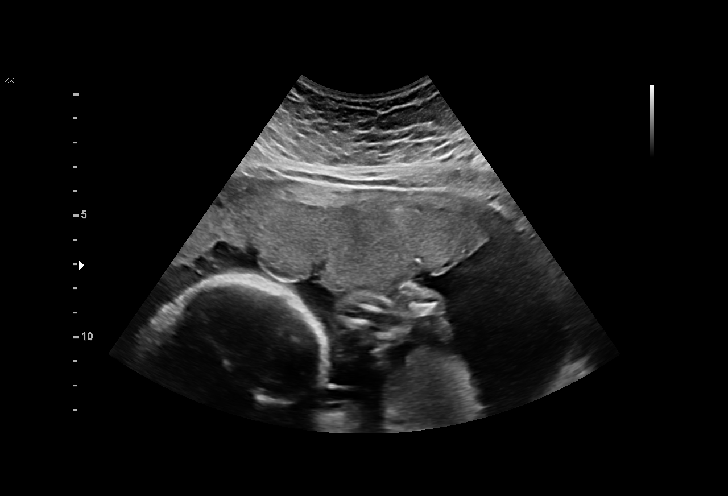
[im 78/96]
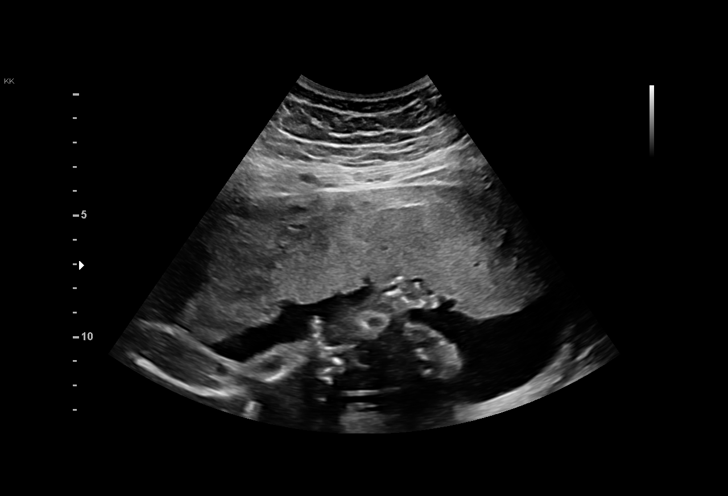
[im 85/96]
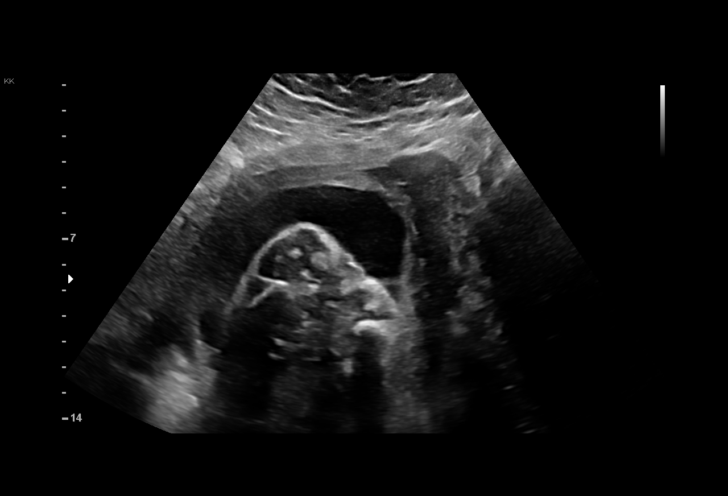
[im 92/96]
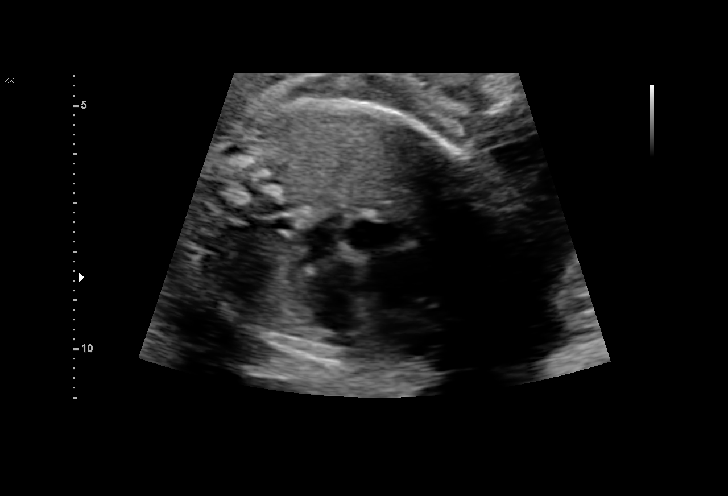

[13 of 28 positions shown; findings below may reference images not displayed]

----------------------------------------------------------------------

 ----------------------------------------------------------------------
Indications

  Advanced maternal age multigravida 35+,
  second trimester (42 yrs)
  26 weeks gestation of pregnancy
  Encounter for antenatal screening for
  malformations
  Late prenatal care, second trimester
  Poor obstetric history: Previous preterm
  delivery, antepartum
  Uterine fibroids
  Encounter for uncertain dates
 ----------------------------------------------------------------------
Fetal Evaluation

 Num Of Fetuses:         1
 Fetal Heart Rate(bpm):  158
 Cardiac Activity:       Observed
 Presentation:           Transverse, head to maternal left
 Placenta:               Anterior
 P. Cord Insertion:      Visualized

 Amniotic Fluid
 AFI FV:      Within normal limits

                             Largest Pocket(cm)

Biometry

 BPD:      64.5  mm     G. Age:  26w 1d         37  %    CI:        75.44   %    70 - 86
                                                         FL/HC:      19.4   %    18.6 -
 HC:      235.5  mm     G. Age:  25w 4d         12  %    HC/AC:      1.02        1.04 -
 AC:      230.6  mm     G. Age:  27w 3d         78  %    FL/BPD:     70.9   %    71 - 87
 FL:       45.7  mm     G. Age:  25w 1d         12  %    FL/AC:      19.8   %    20 - 24
 HUM:      44.9  mm     G. Age:  26w 4d         57  %

 Est. FW:     927  gm      2 lb 1 oz     58  %
OB History

 Gravidity:    5         Term:   2        Prem:   1        SAB:   1
 TOP:          0       Ectopic:  0        Living: 3
Gestational Age

 LMP:           26w 3d        Date:  05/12/18                 EDD:   02/16/19
 U/S Today:     26w 1d                                        EDD:   02/18/19
 Best:          26w 1d     Det. By:  U/S (11/13/18)           EDD:   02/18/19
Anatomy

 Cranium:               Appears normal         Aortic Arch:            Not well visualized
 Cavum:                 Appears normal         Ductal Arch:            Not well visualized
 Ventricles:            Appears normal         Diaphragm:              Not well visualized
 Choroid Plexus:        Appears normal         Stomach:                Appears normal, left
                                                                       sided
 Cerebellum:            Appears normal         Abdomen:                Appears normal
 Posterior Fossa:       Appears normal         Abdominal Wall:         Not well visualized
 Nuchal Fold:           Not applicable (>20    Cord Vessels:           Appears normal (3
                        wks GA)                                        vessel cord)
 Face:                  Not well visualized    Kidneys:                Appear normal
 Lips:                  Not well visualized    Bladder:                Appears normal
 Thoracic:              Appears normal         Spine:                  Appears normal
 Heart:                 Not well visualized    Upper Extremities:      Appears normal
 RVOT:                  Not well visualized    Lower Extremities:      Appears normal
 LVOT:                  Not well visualized

 Other:  Female gender Technically difficult due to fetal position.
Cervix Uterus Adnexa

 Cervix
 Length:              4  cm.
 Normal appearance by transabdominal scan.
Myomas

  Site                     L(cm)      W(cm)      D(cm)      Location
  Anterior Left
 ----------------------------------------------------------------------

  Blood Flow                 RI        PI       Comments

 ----------------------------------------------------------------------
Impression

 Late prenatal care. Patient has not had screening for fetal
 aneuploidies. Patient is unsure of her LMP date and we do
 not have her early ultrasound report if it was performed.
 Fetal biometry is consistent with 26w 1d gestation. Amniotic
 fluid is normal and good fetal activity is seen. No markers of
 aneuploidies or fetal structural defects are seen.
 We have assigned the EDD at 02/18/2019.
Recommendations

 -An appointment was made for her to return in 6 weeks for
 fetal interval growth assessment and completion of fetal
 anatomy.
                 Hance Ferrer, Bymbi

## 2020-06-05 ENCOUNTER — Telehealth: Payer: Self-pay | Admitting: Obstetrics and Gynecology

## 2020-06-05 NOTE — Telephone Encounter (Signed)
Called pt and advised pt to contact Lillia Abed at [phone] to see if something can be worked considering that it done over a year ago.  Pt verbalized understanding with no further questions.   Addison Naegeli, RN  06/05/20

## 2020-06-05 NOTE — Telephone Encounter (Signed)
Patient called somewhat upset, state she received a Bill for [employer] that was done in April of last year, patient said she did not give permission to do the test.

## 2020-11-29 ENCOUNTER — Telehealth: Payer: Self-pay | Admitting: Lactation Services

## 2020-11-29 DIAGNOSIS — O3680X0 Pregnancy with inconclusive fetal viability, not applicable or unspecified: Secondary | ICD-10-CM

## 2020-11-29 NOTE — Telephone Encounter (Signed)
[facility] called to refer patient to our office.   She reports patient with + UPT on 5/3 with very uncertain LMP of maybe 2/25.   Abdominal US was performed and there was a strange looking mass in patients abdomen that was "ghosty looking". The mass did not show blood flow by doppler.   Patient with no current spotting or bleeding. She did  Have some cramping about 2 weeks ago and slight cramping yesterday also.   Would like for patient to be called for follow up.

## 2020-11-30 ENCOUNTER — Encounter: Payer: Self-pay | Admitting: *Deleted

## 2020-11-30 NOTE — Addendum Note (Signed)
Addended by: Ed Blalock on: 11/30/2020 11:27 AM   Modules accepted: Orders

## 2020-11-30 NOTE — Telephone Encounter (Signed)
Discuss with Dr. Donavan Foil who recommends an [place] to determine Viability and follow up in the office.   Called Radiology to schedule [place]. Scheduled for 5/6 at 10 am. At [facility] Radiology- Enter at main entrance. Full bladder.   Called patient to inform her of scheduling for [place] date, location and time . Informed her that front office will be calling to set up appt for [place] results with provider.   My Chart message sent as patient does not have pen handy.

## 2020-12-01 ENCOUNTER — Ambulatory Visit (HOSPITAL_COMMUNITY): Payer: No Typology Code available for payment source

## 2020-12-01 ENCOUNTER — Ambulatory Visit: Payer: No Typology Code available for payment source

## 2020-12-04 ENCOUNTER — Ambulatory Visit
Admission: RE | Admit: 2020-12-04 | Discharge: 2020-12-04 | Disposition: A | Discharge status: Home / Self Care | Payer: 59 | Source: Ambulatory Visit | Attending: Obstetrics and Gynecology | Admitting: Obstetrics and Gynecology

## 2020-12-04 ENCOUNTER — Other Ambulatory Visit: Payer: Self-pay

## 2020-12-04 ENCOUNTER — Telehealth: Payer: Self-pay | Admitting: Lactation Services

## 2020-12-04 ENCOUNTER — Ambulatory Visit (INDEPENDENT_AMBULATORY_CARE_PROVIDER_SITE_OTHER): Payer: 59 | Admitting: Obstetrics and Gynecology

## 2020-12-04 ENCOUNTER — Other Ambulatory Visit: Payer: Self-pay | Admitting: Obstetrics and Gynecology

## 2020-12-04 VITALS — BP 119/75 | HR 72 | Wt 202.9 lb

## 2020-12-04 DIAGNOSIS — O3680X0 Pregnancy with inconclusive fetal viability, not applicable or unspecified: Secondary | ICD-10-CM | POA: Diagnosis not present

## 2020-12-04 DIAGNOSIS — O021 Missed abortion: Secondary | ICD-10-CM | POA: Diagnosis not present

## 2020-12-04 NOTE — Telephone Encounter (Signed)
-----   Message from Warden Fillers, MD sent at 12/04/2020 10:17 AM EDT ----- Pt was not seen for her scheduled appointment, failed pregnancy noted.  Does pt desire expectant management vs cytotec vs D and C?  Pt may need consultation visit

## 2020-12-04 NOTE — Progress Notes (Signed)
Obstetrics and Gynecology Work In Patient Evaluation  Appointment Date: 12/04/2020  OBGYN Clinic: [facility]  [facility]  Chief Complaint: Ultrasound follow up History of Present Illness: Lisa Webb is a 45 y.o.  [id number], seen for the above chief complaint. Her past medical history is significant for DM2, AMA.  Patient seen at the pregnancy care center earlier this month with u/s showing ?fetal viability so she was set up for one today. U/s today shows 9/1 week embryonic demise. LMP 2/28 and pt states it is definite and regular and would've put the pregnancy at 10/0 weeks   No pain, fevers, chills, pain.   Review of Systems: Pertinent items are noted in HPI.    Past Medical History:  Past Medical History:  Diagnosis Date  . Abnormal uterine bleeding 2018  . Gestational diabetes mellitus (GDM) requiring insulin    Postpartum 2 hr GTT shows prediabetes 110/193 >> Prediabetes  . Hernia, abdominal 2007  . Migraines    pt does not take meds  . Prediabetes   . Rectal bleeding 2018  . Severe preeclampsia     Past Surgical History:  Past Surgical History:  Procedure Laterality Date  . CESAREAN SECTION N/A 12/22/2018   Procedure: CESAREAN SECTION;  Surgeon: Tereso Newcomer, MD;  Location: [facility] LD ORS;  Service: Obstetrics;  Laterality: N/A;  . COLONOSCOPY  2018   rectal bleeding, Fam Hx of colon Ca  . ENDOMETRIAL BIOPSY  2018   normal  . HERNIA REPAIR  2007    Past Obstetrical History:  OB History  Gravida Para Term Preterm AB Living  5 4 2 2 1 4   SAB IAB Ectopic Multiple Live Births  1     0 4    # Outcome Date GA Lbr Len/2nd Weight Sex Delivery Anes PTL Lv  5 Preterm 12/22/18 105w5d  3 lb 1.7 oz (1.41 kg) F CS-LTranv Spinal  LIV  4 Term 04/03/06 [redacted]w[redacted]d  8 lb 3 oz (3.714 kg) F Vag-Spont EPI N LIV  3 SAB 02/14/05 [redacted]w[redacted]d         2 Term 09/19/99 [redacted]w[redacted]d  7 lb 6 oz (3.345 kg) F Vag-Spont None N LIV  1  Preterm 09/08/94   4 lb 7 oz (2.013 kg) M Vag-Spont None N LIV    Past Gynecological History: As per HPI. History of Pap Smear(s): Yes.   Last pap 2020, which was neg and hpv neg Contraception: none  Social History:  Social History   Socioeconomic History  . Marital status: Single    Spouse name: Not on file  . Number of children: Not on file  . Years of education: Not on file  . Highest education level: Not on file  Occupational History    Employer: [employer]  Tobacco Use  . Smoking status: Never Smoker  . Smokeless tobacco: Never Used  Vaping Use  . Vaping Use: Never used  Substance and Sexual Activity  . Alcohol use: Not Currently  . Drug use: Never  . Sexual activity: Not Currently  Other Topics Concern  . Not on file  Social History Narrative  . Not on file   Social Determinants of Health   Financial Resource Strain: Not on file  Food Insecurity: Not on file  Transportation Needs: Not on file  Physical Activity: Not on file  Stress: Not on file  Social Connections: Not on file  Intimate Partner Violence: Not on file  Family History:  Family History  Problem Relation Age of Onset  . Cancer Mother        colon  . Hypertension Father   . Dementia Father   . Diabetes Sister   . Thyroid disease Sister     Medications Patient states she is not on any medications  Allergies Patient has no known allergies.   Physical Exam:  BP 119/75   Pulse 72   Wt 202 lb 14.4 oz (92 kg)   BMI 35.94 kg/m  Body mass index is 35.94 kg/m. General appearance: Well nourished, well developed female in no acute distress.  Respiratory:  Normal respiratory effort Neuro/Psych:  Normal mood and affect.  Skin:  Warm and dry.    Laboratory: O POS  Radiology:  Narrative & Impression  CLINICAL DATA:  Assess for viability. Pregnant patient, 10 weeks and 3 days based on her last menstrual period.  EXAM: OBSTETRIC <14 WK  ULTRASOUND  TECHNIQUE: Transabdominal ultrasound was performed for evaluation of the gestation as well as the maternal uterus and adnexal regions.  COMPARISON:  None.  FINDINGS: Intrauterine gestational sac: Single  Yolk sac:  Visualized.  Embryo:  Visualized.  Cardiac Activity: Not Visualized.  Heart Rate: Not applicable.  CRL:   25.2 mm   9 w 1 d                  [place] EDC: 07/08/2021.  Subchorionic hemorrhage:  None.  Maternal uterus/adnexae: Small fibroid noted along the posterior aspect of the uterus, mural. No other uterine masses. Cervix is closed. Ovaries and adnexa are unremarkable. No pelvic free fluid.  IMPRESSION: 1. Intrauterine gestational sac, yolk sac and embryo, but no cardiac activity or heart rate detectable. Embryo measures 25.2 mm in length. Findings meet definitive criteria for failed pregnancy. This follows SRU consensus guidelines: Diagnostic Criteria for Nonviable Pregnancy Early in the First Trimester. [facility] Med [phone]. 2. No subchorionic hemorrhage.  Normal ovaries and adnexa.   Electronically Signed   By: Amie Portland M.D.   On: 12/04/2020 08:43   Images reviewed  Assessment: pt stable  Plan:  I told her most likely reason is AMA.   Options d/w pt and I told her I recommend d&c given how far along she is. She would like more time to think about it and will let us know. ED precautions advised. Pt told if she decides on what she would like to do, she can call us   Follow up re: screening mammogram next visit  RTC 1wk follow up  Cornelia Copa MD Attending Center for [facility] [address])

## 2020-12-04 NOTE — Telephone Encounter (Signed)
Patient was seen in the office by Dr. Vergie Living. Plan for failed pregnancy discussed.

## 2020-12-05 ENCOUNTER — Ambulatory Visit: Payer: No Typology Code available for payment source | Admitting: Obstetrics and Gynecology

## 2020-12-11 ENCOUNTER — Telehealth: Payer: Self-pay | Admitting: Lactation Services

## 2020-12-11 NOTE — Telephone Encounter (Signed)
Called patient again and was not able to reach her. Will send My Chart message.

## 2020-12-11 NOTE — Telephone Encounter (Signed)
Patient called and LM that she would like a call back as she has questions before her appointment tomorrow.   Per chart review, patient does not have follow up appointment scheduled.   Called patient and she did not answer. LM for her to call the office at her convenience.

## 2020-12-13 ENCOUNTER — Ambulatory Visit: Payer: No Typology Code available for payment source | Admitting: Obstetrics and Gynecology
